# Patient Record
Sex: Female | Born: 1985
Health system: Southern US, Community
[De-identification: ages and names within clinical notes are randomized; demographics above are authoritative.]

## PROBLEM LIST (undated history)

## (undated) DIAGNOSIS — M2040 Other hammer toe(s) (acquired), unspecified foot: Secondary | ICD-10-CM

## (undated) DIAGNOSIS — M2010 Hallux valgus (acquired), unspecified foot: Secondary | ICD-10-CM

## (undated) DIAGNOSIS — T7840XA Allergy, unspecified, initial encounter: Secondary | ICD-10-CM

## (undated) DIAGNOSIS — R51 Headache: Secondary | ICD-10-CM

## (undated) DIAGNOSIS — R011 Cardiac murmur, unspecified: Secondary | ICD-10-CM

## (undated) HISTORY — DX: Allergy, unspecified, initial encounter: T78.40XA

## (undated) HISTORY — PX: NO PAST SURGERIES: SHX2092

## (undated) HISTORY — PX: FOOT SURGERY: SHX648

---
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
## 2007-01-15 ENCOUNTER — Emergency Department (HOSPITAL_COMMUNITY): Admission: EM | Admit: 2007-01-15 | Discharge: 2007-01-15 | Payer: Self-pay | Admitting: Emergency Medicine

## 2010-03-10 ENCOUNTER — Other Ambulatory Visit (HOSPITAL_COMMUNITY)
Admission: RE | Admit: 2010-03-10 | Discharge: 2010-03-10 | Disposition: A | Discharge status: Home / Self Care | Payer: BC Managed Care – PPO | Source: Ambulatory Visit | Attending: Obstetrics and Gynecology | Admitting: Obstetrics and Gynecology

## 2010-03-10 DIAGNOSIS — Z113 Encounter for screening for infections with a predominantly sexual mode of transmission: Secondary | ICD-10-CM | POA: Insufficient documentation

## 2010-03-10 DIAGNOSIS — Z01419 Encounter for gynecological examination (general) (routine) without abnormal findings: Secondary | ICD-10-CM | POA: Insufficient documentation

## 2010-03-10 DIAGNOSIS — Z1159 Encounter for screening for other viral diseases: Secondary | ICD-10-CM | POA: Insufficient documentation

## 2011-03-26 DIAGNOSIS — Z01419 Encounter for gynecological examination (general) (routine) without abnormal findings: Secondary | ICD-10-CM | POA: Insufficient documentation

## 2011-03-26 DIAGNOSIS — R87810 Cervical high risk human papillomavirus (HPV) DNA test positive: Secondary | ICD-10-CM | POA: Insufficient documentation

## 2011-04-06 DIAGNOSIS — M2040 Other hammer toe(s) (acquired), unspecified foot: Secondary | ICD-10-CM

## 2011-04-06 DIAGNOSIS — M2010 Hallux valgus (acquired), unspecified foot: Secondary | ICD-10-CM

## 2011-04-06 HISTORY — DX: Other hammer toe(s) (acquired), unspecified foot: M20.40

## 2011-04-06 HISTORY — DX: Hallux valgus (acquired), unspecified foot: M20.10

## 2011-04-09 ENCOUNTER — Other Ambulatory Visit (HOSPITAL_COMMUNITY)
Admission: RE | Admit: 2011-04-09 | Discharge: 2011-04-09 | Disposition: A | Discharge status: Home / Self Care | Payer: Managed Care, Other (non HMO) | Source: Ambulatory Visit | Attending: Obstetrics and Gynecology | Admitting: Obstetrics and Gynecology

## 2011-04-28 ENCOUNTER — Encounter (HOSPITAL_BASED_OUTPATIENT_CLINIC_OR_DEPARTMENT_OTHER): Payer: Self-pay | Admitting: *Deleted

## 2011-04-30 ENCOUNTER — Ambulatory Visit (INDEPENDENT_AMBULATORY_CARE_PROVIDER_SITE_OTHER): Payer: Managed Care, Other (non HMO) | Admitting: Family Medicine

## 2011-04-30 VITALS — BP 132/86 | HR 70 | Temp 98.3°F | Resp 16 | Ht 71.5 in | Wt 148.6 lb

## 2011-04-30 DIAGNOSIS — Z0000 Encounter for general adult medical examination without abnormal findings: Secondary | ICD-10-CM

## 2011-04-30 DIAGNOSIS — Z01818 Encounter for other preprocedural examination: Secondary | ICD-10-CM

## 2011-04-30 NOTE — Patient Instructions (Signed)
Cleared for surgery. Return to see [place] if needed.

## 2011-04-30 NOTE — Progress Notes (Signed)
Subjective: Patient is here for a preop physical exam. She is going to have podiatric surgery tomorrow an outpatient basis. She thinks she is having a general anesthetic.  Past medical history: Surgeries: None Maj. 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 Graduated from [employer] last year. He is single, no children. Does not use tobacco or drugs. Social alcohol intake.  Family history: Hypertension, high cholesterol  Review of systems: Gen.:  no complaints HEENT: Unremarkable Respiratory: Unremarkable Cardiovascular: Unremarkable GI: Unremarkable GU: Unremarkable. Menses are regular. Musculoskeletal: Unremarkable except for the foot problems  neurologic: Fairly frequent headaches, relieved by ibuprofen Endocrinologic unremarkable Dermatologic: Unremarkable  Physical exam: Filed Vitals:   04/30/11 1017  BP: 132/86  Pulse: 70  Temp: 98.3 F (36.8 C)  Resp: 16   Pleasant alert ortiented Afro-American female in no acute distress at this time HEENT: Eyes PERRLA. Fundi benign. Throat clear. Neck supple without nodes. Chest clear to auscultation. Heart regular without murmurs. No carotid bruits. Abdomen soft without masses tenderness. Extremities are without edema. Pedal pulses.  Assessment: Normal preop physical examination Hammertoe History of headaches  Plan: Patient is cleared for her surgery

## 2011-05-04 ENCOUNTER — Ambulatory Visit (HOSPITAL_BASED_OUTPATIENT_CLINIC_OR_DEPARTMENT_OTHER)
Admission: RE | Admit: 2011-05-04 | Discharge: 2011-05-04 | Disposition: A | Discharge status: Home / Self Care | Payer: Managed Care, Other (non HMO) | Source: Ambulatory Visit | Attending: Podiatry | Admitting: Podiatry

## 2011-05-04 ENCOUNTER — Encounter (HOSPITAL_BASED_OUTPATIENT_CLINIC_OR_DEPARTMENT_OTHER)
Admission: RE | Disposition: A | Discharge status: Home / Self Care | Payer: Self-pay | Source: Ambulatory Visit | Attending: Podiatry

## 2011-05-04 ENCOUNTER — Encounter (HOSPITAL_BASED_OUTPATIENT_CLINIC_OR_DEPARTMENT_OTHER): Payer: Self-pay

## 2011-05-04 ENCOUNTER — Ambulatory Visit (HOSPITAL_BASED_OUTPATIENT_CLINIC_OR_DEPARTMENT_OTHER): Payer: Managed Care, Other (non HMO) | Admitting: Anesthesiology

## 2011-05-04 ENCOUNTER — Encounter (HOSPITAL_BASED_OUTPATIENT_CLINIC_OR_DEPARTMENT_OTHER): Payer: Self-pay | Admitting: Anesthesiology

## 2011-05-04 DIAGNOSIS — M2040 Other hammer toe(s) (acquired), unspecified foot: Secondary | ICD-10-CM | POA: Insufficient documentation

## 2011-05-04 DIAGNOSIS — M21619 Bunion of unspecified foot: Secondary | ICD-10-CM | POA: Insufficient documentation

## 2011-05-04 DIAGNOSIS — R600 Localized edema: Secondary | ICD-10-CM

## 2011-05-04 HISTORY — DX: Cardiac murmur, unspecified: R01.1

## 2011-05-04 HISTORY — DX: Other hammer toe(s) (acquired), unspecified foot: M20.40

## 2011-05-04 HISTORY — DX: Headache: R51

## 2011-05-04 HISTORY — PX: HAMMER TOE SURGERY: SHX385

## 2011-05-04 HISTORY — DX: Hallux valgus (acquired), unspecified foot: M20.10

## 2011-05-04 HISTORY — PX: BUNIONECTOMY: SHX129

## 2011-05-04 SURGERY — CORRECTION, HAMMER TOE
Anesthesia: Monitor Anesthesia Care | Site: Foot | Laterality: Right | Wound class: Clean

## 2011-05-04 MED ORDER — DEXAMETHASONE SODIUM PHOSPHATE 4 MG/ML IJ SOLN
INTRAMUSCULAR | Status: DC | PRN
  Administered 2011-05-04: 10 mg via INTRAVENOUS

## 2011-05-04 MED ORDER — LACTATED RINGERS IV SOLN
INTRAVENOUS | Status: DC
  Administered 2011-05-04: 07:00:00 via INTRAVENOUS

## 2011-05-04 MED ORDER — MIDAZOLAM HCL 5 MG/5ML IJ SOLN
INTRAMUSCULAR | Status: DC | PRN
  Administered 2011-05-04: 2 mg via INTRAVENOUS

## 2011-05-04 MED ORDER — CLINDAMYCIN PHOSPHATE 300 MG/50ML IV SOLN
300.0000 mg | Freq: Once | INTRAVENOUS | Status: AC
  Administered 2011-05-04: 300 mg via INTRAVENOUS

## 2011-05-04 MED ORDER — FENTANYL CITRATE 0.05 MG/ML IJ SOLN
INTRAMUSCULAR | Status: DC | PRN
  Administered 2011-05-04: 100 ug via INTRAVENOUS

## 2011-05-04 MED ORDER — PROPOFOL 10 MG/ML IV EMUL
INTRAVENOUS | Status: DC | PRN
  Administered 2011-05-04: 100 mg via INTRAVENOUS

## 2011-05-04 MED ORDER — ACETAMINOPHEN 325 MG PO TABS: 650.0000 mg | ORAL_TABLET | ORAL | Status: DC | PRN

## 2011-05-04 MED ORDER — ACETAMINOPHEN 650 MG RE SUPP: 650.0000 mg | RECTAL | Status: DC | PRN

## 2011-05-04 MED ORDER — ONDANSETRON HCL 4 MG/2ML IJ SOLN: 4.0000 mg | Freq: Four times a day (QID) | INTRAMUSCULAR | Status: DC | PRN

## 2011-05-04 MED ORDER — SODIUM CHLORIDE 0.9 % IV SOLN: 250.0000 mL | INTRAVENOUS | Status: DC | PRN

## 2011-05-04 MED ORDER — ONDANSETRON HCL 4 MG/2ML IJ SOLN
INTRAMUSCULAR | Status: DC | PRN
  Administered 2011-05-04: 4 mg via INTRAVENOUS

## 2011-05-04 MED ORDER — FENTANYL CITRATE 0.05 MG/ML IJ SOLN
25.0000 ug | INTRAMUSCULAR | Status: DC | PRN
  Administered 2011-05-04: 50 ug via INTRAVENOUS

## 2011-05-04 MED ORDER — SODIUM CHLORIDE 0.9 % IJ SOLN: 3.0000 mL | INTRAMUSCULAR | Status: DC | PRN

## 2011-05-04 MED ORDER — PROPOFOL 10 MG/ML IV EMUL
INTRAVENOUS | Status: DC | PRN
  Administered 2011-05-04: 50 ug/kg/min via INTRAVENOUS

## 2011-05-04 MED ORDER — LIDOCAINE HCL (PF) 2 % IJ SOLN
INTRAMUSCULAR | Status: DC | PRN
  Administered 2011-05-04: 8 mL

## 2011-05-04 MED ORDER — SODIUM CHLORIDE 0.9 % IJ SOLN: 3.0000 mL | Freq: Two times a day (BID) | INTRAMUSCULAR | Status: DC

## 2011-05-04 MED ORDER — BUPIVACAINE HCL (PF) 0.5 % IJ SOLN
INTRAMUSCULAR | Status: DC | PRN
  Administered 2011-05-04: 8 mL

## 2011-05-04 SURGICAL SUPPLY — 56 items
BANDAGE ESMARK 6X9 LF (GAUZE/BANDAGES/DRESSINGS) ×2 IMPLANT
BLADE AVERAGE 25X9 (BLADE) ×2 IMPLANT
BLADE OSC/SAG .038X5.5 CUT EDG (BLADE) ×2 IMPLANT
BLADE SURG 15 STRL LF DISP TIS (BLADE) ×2 IMPLANT
BLADE SURG 15 STRL SS (BLADE) ×3
BNDG CMPR 9X6 STRL LF SNTH (GAUZE/BANDAGES/DRESSINGS) ×2
BNDG COHESIVE 4X5 TAN STRL (GAUZE/BANDAGES/DRESSINGS) ×3 IMPLANT
BNDG ESMARK 6X9 LF (GAUZE/BANDAGES/DRESSINGS) ×3 IMPLANT
CANISTER SUCTION 1200CC (MISCELLANEOUS) ×2 IMPLANT
CLOTH BEACON ORANGE TIMEOUT ST (SAFETY) ×3 IMPLANT
COVER TABLE BACK 60X90 (DRAPES) ×3 IMPLANT
DRAPE EXTREMITY T 121X128X90 (DRAPE) ×3 IMPLANT
DRAPE OEC MINIVIEW 54X84 (DRAPES) ×2 IMPLANT
DRAPE U 20/CS (DRAPES) ×3 IMPLANT
DRAPE U-SHAPE 47X51 STRL (DRAPES) ×3 IMPLANT
DURAPREP 26ML APPLICATOR (WOUND CARE) ×3 IMPLANT
ELECT REM PT RETURN 9FT ADLT (ELECTROSURGICAL) ×3 IMPLANT
ELECTRODE REM PT RTRN 9FT ADLT (ELECTROSURGICAL) ×2 IMPLANT
GAUZE XEROFORM 1X8 LF (GAUZE/BANDAGES/DRESSINGS) IMPLANT
GLOVE BIO SURGEON STRL SZ 6.5 (GLOVE) ×2 IMPLANT
GLOVE SS BIOGEL STRL SZ 8 (GLOVE) ×3 IMPLANT
GLOVE SUPERSENSE BIOGEL SZ 8 (GLOVE) ×2
GOWN PREVENTION PLUS XLARGE (GOWN DISPOSABLE) ×3 IMPLANT
GOWN PREVENTION PLUS XXLARGE (GOWN DISPOSABLE) ×5 IMPLANT
NDL 1/2 CIR CATGUT .05X1.09 (NEEDLE) IMPLANT
NEEDLE 1/2 CIR CATGUT .05X1.09 (NEEDLE) IMPLANT
NEEDLE HYPO 22GX1.5 SAFETY (NEEDLE) ×4 IMPLANT
PACK BASIN DAY SURGERY FS (CUSTOM PROCEDURE TRAY) ×3 IMPLANT
PADDING CAST ABS 4INX4YD NS (CAST SUPPLIES) ×1
PADDING CAST ABS COTTON 4X4 ST (CAST SUPPLIES) ×2 IMPLANT
PENCIL BUTTON HOLSTER BLD 10FT (ELECTRODE) ×3 IMPLANT
SCOTCHCAST PLUS 3X4 WHITE (CAST SUPPLIES) IMPLANT
SCOTCHCAST PLUS 4X4 WHITE (CAST SUPPLIES) IMPLANT
SPONGE GAUZE 4X4 12PLY (GAUZE/BANDAGES/DRESSINGS) ×3 IMPLANT
STAPLER VISISTAT 35W (STAPLE) IMPLANT
STOCKINETTE 6  STRL (DRAPES) ×1
STOCKINETTE 6 STRL (DRAPES) ×2 IMPLANT
SUCTION FRAZIER TIP 10 FR DISP (SUCTIONS) ×2 IMPLANT
SUT ETHILON 3 0 PS 1 (SUTURE) IMPLANT
SUT FIBERWIRE #2 38 T-5 BLUE (SUTURE) IMPLANT
SUT MERSILENE 2.0 SH NDLE (SUTURE) IMPLANT
SUT STEEL 2 0 (SUTURE) IMPLANT
SUT VIC AB 0 CT1 27 (SUTURE) ×3
SUT VIC AB 0 CT1 27XBRD ANBCTR (SUTURE) ×2 IMPLANT
SUT VIC AB 2-0 SH 27 (SUTURE)
SUT VIC AB 2-0 SH 27XBRD (SUTURE) IMPLANT
SUT VIC AB 3-0 SH 27 (SUTURE) ×3
SUT VIC AB 3-0 SH 27X BRD (SUTURE) ×2 IMPLANT
SUT VICRYL 4-0 PS2 18IN ABS (SUTURE) IMPLANT
SUTURE FIBERWR #2 38 T-5 BLUE (SUTURE) IMPLANT
SYR BULB 3OZ (MISCELLANEOUS) ×3 IMPLANT
SYR CONTROL 10ML LL (SYRINGE) ×4 IMPLANT
TOWEL OR 17X24 6PK STRL BLUE (TOWEL DISPOSABLE) ×6 IMPLANT
TUBE CONNECTING 20X1/4 (TUBING) ×2 IMPLANT
UNDERPAD 30X30 INCONTINENT (UNDERPADS AND DIAPERS) ×3 IMPLANT
WATER STERILE IRR 1000ML POUR (IV SOLUTION) ×3 IMPLANT

## 2011-05-04 NOTE — Op Note (Signed)
Procedure(s): HAMMER TOE CORRECTION BUNIONECTOMY Procedure Note  Kathryn Roberson female 26 y.o. 05/04/2011  Procedure(s) and Anesthesia Type:    * HAMMER TOE CORRECTION - Monitor Anesthesia Care    * BUNIONECTOMY - Monitor Anesthesia Care  Surgeon(s) and Role:    * Merwyn Katos, DPM - Primary   Indications: The patient presented with history of painful bunion and hammertoes 2,3,4 rt . A exam revealed findings of hallux valvus and hammertoes rt.. The patient now presents for bunionectomy and correction hammertoes 2,3,4 rt. after discussing therapeutic alternatives.        Surgeon: Merwyn Katos   Assistants: N/A   Anesthesia: Monitored Local Anesthesia with Sedation  ASA Class: 1    Procedure Detail  Bunionectomy Procedure Note  Indications: Severe Hallux Abducto Valgus Deformity, right foot  with symptoms limiting function.  Pre-operative Diagnosis: Hallux Abducto Valgus Deformity, right foot    Post-operative Diagnosis: Hallux Abducto Valgus Deformity, right foot   Surgeon: Merwyn Katos   Assistants: None  Anesthesia: Monitored Local Anesthesia with Sedation  ASA Class: 1  Procedure Details  The patient was seen in the Holding Room. The risks, benefits, complications, treatment options, and expected outcomes were discussed with the patient. The risks and potential complications of their problem and purposed treatment include but are not limited to infection, nerve injury, vascular injury, nonunion of the syndesmosis, persistent pain, potential skin necrosis, deep vein thrombosis, possible pulmonary embolus, complications of the anesthetics and failure of the implant.  The patient concurred with the proposed plan, giving informed consent.  The site of surgery properly noted/marked. The patient was taken to Operating Room # 1, identified as Kathryn Roberson and the procedure verified as right Bunionectomy. A Time Out was held and the above information confirmed.  The  patient was brought to the operating room, placed on the operating table in a supine position. Following the successful induction of anesthesia, an approximately 5 cm incision was made over the dorsal-medial aspect of the 1st. MPJ. The incision was then deepened through the subcutaneous tissue, using sharp and blunt dissection. Care was taken to identify and retract all vital neuro and vascular structures. All bleeders were ligated and cauterized as needed. Soft tissue dissection yielded visualization of the 1 1st. MPJ capsule and an longitudinal capsulotomy was performed over the dorsal aspect of the 1st. MPJ. All capsular structures were sharply freed from about the head of the medial aspect of the 1st. metatarsal.  A sagital saw was utilized to resect the dorsal-medial prominence. Attention was then directed into the 1st. IS via the original skin incision, where soft tissue dissection yielded visualization of the lateral joint structures. At this time the conjoined tendon was sharply transected at it's attachment to the base of the proximal phalanx in a J-stroke fashion. The lateral contracture present on the hallux was noted to be reduced, and the sesamoid apparatus was noted to float into a more corrected medial position. Correction of the deformity was assessed at this time and noted to be good. The wound was thoroughly irrigated with Kantrex solution. Redundant capsular tissue was resected as necessary. The joint capsule was closed utilizing 3-0 Vicryl..  Skin was closed utilizing 4-0 Monocril in an subcutaneous running technique. The operation site was infiltrated with approximately 8 cc's of 0.5% Marcaine plain and 2 cc's of Decadron.   Two converging semieliptical incisions made dorsal DIPJ of 2nd toe rt.. Skin segment excised and dissection carried down through [place], Several superficial vessels were cauterized  and cut. Extensor tendon transected and soft tissue freed from middle phalangeal head. Head  resect w/ sagittal saw. Surgery site irrigated with normal saline. Extensor Tendon re approximated with 4-0 vicryl. Skin closed with 4-0 nylon continuous interlocking sutures.  Two converging semieliptical incisions made dorsal DIPJ of 3rd toe rt.. Skin segment excised and dissection carried down through [place], Several superficial vessels were cauterized and cut. Extensor tendon transected and soft tissue freed from middle phalangeal head. Head resect w/ sagittal saw. Surgery site irrigated with normal saline. Extensor Tendon re approximated with 4-0 vicryl. Skin closed with 4-0 nylon continuous interlocking sutures.  Longitudinal incisionmade dorsal PIPJ of 4th toe rt.. Skin  dissection carried down through [place], Several superficial vessels were cauterized and cut. Extensor tendon transected and soft tissue freed from proximal phalangeal head. Head resect w/ sagittal saw. Surgery site irrigated with normal saline. Extensor Tendon re approximated with 4-0 vicryl. Skin closed with 4-0 nylon continuous interlocking sutures.  Applied xeroform gauze with gauze Kling dressind. Touriquet released and good vascularity noted in digits. Applied light compression dressing.  Instrument, sponge, and needle counts were correct prior to wound closure and at the conclusion of the case.   Findings: None  Estimated Blood Loss:  Minimal         Drains: None         Total IV Fluids:         Specimens: None         Implants: None         Complications:  None; patient tolerated the procedure well.         Disposition: PACU - hemodynamically stable.         Condition: stable  Attending Attestation: I was present and scrubbed for the entire procedure.HAMMER TOE CORRECTION, BUNIONECTOMY  Findings: None  Estimated Blood Loss:  Minimal         Drains: None       Blood Given: none          Specimens: None          Implants: none        Complications:  * No complications entered in OR log *          Disposition: PACU - hemodynamically stable.         Condition: stable

## 2011-05-04 NOTE — Transfer of Care (Signed)
Immediate Anesthesia Transfer of Care Note  Patient: Kathryn Roberson  Procedure(s) Performed: Procedure(s) (LRB): HAMMER TOE CORRECTION (Right) BUNIONECTOMY (Right)  Patient Location: PACU  Anesthesia Type: MAC  Level of Consciousness: awake, alert  and oriented  Airway & Oxygen Therapy: Patient Spontanous Breathing and Patient connected to face mask oxygen  Post-op Assessment: Report given to PACU RN and Post -op Vital signs reviewed and stable  Post vital signs: Reviewed and stable  Complications: No apparent anesthesia complications

## 2011-05-04 NOTE — H&P (Signed)
Kathryn Roberson is an 26 y.o. female.   Chief Complaint: painful bunion and hammer toes 2,3,4 RT HPI: Pain at pedal deformities despite concervative tx.  Past Medical History  Diagnosis Date  . Headache     migraines  . Heart murmur as a child    states never had any problems  . Hallux valgus 04/2011    right  . Hammer toe 04/2011    right 2nd, 3rd, 4th toes    Past Surgical History  Procedure Date  . No past surgeries     History reviewed. No pertinent family history. Social History:  reports that she has never smoked. She has never used smokeless tobacco. She reports that she drinks alcohol. She reports that she does not use illicit drugs.  Allergies:  Allergies  Allergen Reactions  . Penicillins Rash    Medications Prior to Admission  Medication Sig Dispense Refill  . norethindrone-ethinyl estradiol (MICROGESTIN,JUNEL,LOESTRIN) 1-20 MG-MCG tablet Take 1 tablet by mouth daily. AM        No results found for this or any previous visit (from the past 48 hour(s)). No results found.  ROS  Blood pressure 154/105, pulse 54, temperature 97.6 F (36.4 C), temperature source Oral, resp. rate 13, height 5\' 11"  (1.803 m), weight 65.772 kg (145 lb), last menstrual period 04/26/2011, SpO2 100.00%. Physical Exam   Assessment/Plan Hallux valgus RT, hammertoes 2,3,4 RT Modifed McBride bunionectomy w/ possible Akin, correction hammertoes 2,3,4 RT   Merwyn Katos 05/04/2011, 9:42 AM

## 2011-05-04 NOTE — Anesthesia Postprocedure Evaluation (Signed)
Anesthesia Post Note  Patient: Kathryn Roberson  Procedure(s) Performed: Procedure(s) (LRB): HAMMER TOE CORRECTION (Right) BUNIONECTOMY (Right)  Anesthesia type: MAC  Patient location: PACU  Post pain: Pain level controlled and Adequate analgesia  Post assessment: Post-op Vital signs reviewed, Patient's Cardiovascular Status Stable and Respiratory Function Stable  Last Vitals:  Filed Vitals:   05/04/11 0930  BP: 154/105  Pulse: 54  Temp:   Resp: 13    Post vital signs: Reviewed and stable  Level of consciousness: awake, alert  and oriented  Complications: No apparent anesthesia complications

## 2011-05-04 NOTE — Discharge Instructions (Signed)
Call your surgeon if you experience:   1.  Fever over 101.0. 2.  Inability to urinate. 3.  Nausea and/or vomiting. 4.  Extreme swelling or bruising at the surgical site. 5.  Continued bleeding from the incision. 6.  Increased pain, redness or drainage from the incision. 7.  Problems related to your pain medication.  Post Anesthesia Home Care Instructions  Activity: Get plenty of rest for the remainder of the day. A responsible adult should stay with you for 24 hours following the procedure.  For the next 24 hours, DO NOT: -Drive a car -Operate machinery -Drink alcoholic beverages -Take any medication unless instructed by your physician -Make any legal decisions or sign important papers.  Meals: Start with liquid foods such as gelatin or soup. Progress to regular foods as tolerated. Avoid greasy, spicy, heavy foods. If nausea and/or vomiting occur, drink only clear liquids until the nausea and/or vomiting subsides. Call your physician if vomiting continues.  Special Instructions/Symptoms: Your throat may feel dry or sore from the anesthesia or the breathing tube placed in your throat during surgery. If this causes discomfort, gargle with warm salt water. The discomfort should disappear within 24 hours.   

## 2011-05-04 NOTE — Anesthesia Preprocedure Evaluation (Signed)
Anesthesia Evaluation  Patient identified by MRN, date of birth, ID band Patient awake    Reviewed: Allergy & Precautions, H&P , NPO status , Patient's Chart, lab work & pertinent test results  Airway Mallampati: II  Neck ROM: full    Dental   Pulmonary          Cardiovascular     Neuro/Psych  Headaches,    GI/Hepatic   Endo/Other    Renal/GU      Musculoskeletal   Abdominal   Peds  Hematology   Anesthesia Other Findings   Reproductive/Obstetrics                           Anesthesia Physical Anesthesia Plan  ASA: I  Anesthesia Plan: MAC   Post-op Pain Management:    Induction: Intravenous  Airway Management Planned: Simple Face Mask  Additional Equipment:   Intra-op Plan:   Post-operative Plan:   Informed Consent: I have reviewed the patients History and Physical, chart, labs and discussed the procedure including the risks, benefits and alternatives for the proposed anesthesia with the patient or authorized representative who has indicated his/her understanding and acceptance.     Plan Discussed with: CRNA and Surgeon  Anesthesia Plan Comments:         Anesthesia Quick Evaluation

## 2011-05-05 ENCOUNTER — Encounter (HOSPITAL_BASED_OUTPATIENT_CLINIC_OR_DEPARTMENT_OTHER): Payer: Self-pay | Admitting: Podiatry

## 2011-07-28 ENCOUNTER — Ambulatory Visit (INDEPENDENT_AMBULATORY_CARE_PROVIDER_SITE_OTHER): Payer: Managed Care, Other (non HMO) | Admitting: Emergency Medicine

## 2011-07-28 VITALS — BP 126/72 | HR 78 | Temp 98.1°F | Resp 16 | Ht 71.5 in | Wt 144.0 lb

## 2011-07-28 DIAGNOSIS — L508 Other urticaria: Secondary | ICD-10-CM

## 2011-07-28 MED ORDER — METHYLPREDNISOLONE ACETATE 80 MG/ML IJ SUSP
120.0000 mg | Freq: Once | INTRAMUSCULAR | Status: AC
  Administered 2011-07-28: 120 mg via INTRAMUSCULAR

## 2011-07-28 NOTE — Progress Notes (Signed)
   Date:  07/28/2011   Name:  LAVONDA THAL   DOB:  07-07-1985   MRN:  [id number]  PCP:  Janace Hoard, MD    Chief Complaint: Rash   History of Present Illness:  Kathryn Roberson is a 26 y.o. very pleasant female patient who presents with the following:  Developed generalized hives after working with boxes of old clothing stock at work.  No respiratory symptoms, denies other contact with allergen or personal products  There is no problem list on file for this patient.   Past Medical History  Diagnosis Date  . Headache     migraines  . Heart murmur as a child    states never had any problems  . Hallux valgus 04/2011    right  . Hammer toe 04/2011    right 2nd, 3rd, 4th toes    Past Surgical History  Procedure Date  . No past surgeries   . Hammer toe surgery 05/04/2011    Procedure: HAMMER TOE CORRECTION;  Surgeon: Merwyn Katos, DPM;  Location: [facility];  Service: Podiatry;  Laterality: Right;  right 2nd,3rd,&4th  . Bunionectomy 05/04/2011    Procedure: Arbutus Leas;  Surgeon: Merwyn Katos, DPM;  Location: [facility];  Service: Podiatry;  Laterality: Right;  mcbride bunionectomy right foot    History  Substance Use Topics  . Smoking status: Never Smoker   . Smokeless tobacco: Never Used  . Alcohol Use: Yes     socially    No family history on file.  Allergies  Allergen Reactions  . Penicillins Rash    Medication list has been reviewed and updated.  Current Outpatient Prescriptions on File Prior to Visit  Medication Sig Dispense Refill  . norethindrone-ethinyl estradiol (MICROGESTIN,JUNEL,LOESTRIN) 1-20 MG-MCG tablet Take 1 tablet by mouth daily. AM       No current facility-administered medications on file prior to visit.    Review of Systems:  As per HPI, otherwise negative.    Physical Examination: Filed Vitals:   07/28/11 1256  BP: 126/72  Pulse: 78  Temp: 98.1 F (36.7 C)  Resp: 16   Filed Vitals:   07/28/11 1256  Height: 5' 11.5" (1.816 m)  Weight: 144 lb (65.318 kg)   Body mass index is 19.80 kg/(m^2). Ideal Body Weight: Weight in (lb) to have BMI = 25: 181.4    GEN: WDWN, NAD, Non-toxic, Alert & Oriented x 3 HEENT: Atraumatic, Normocephalic.  Ears and Nose: No external deformity. EXTR: No clubbing/cyanosis/edema NEURO: Normal gait.  PSYCH: Normally interactive. Conversant. Not depressed or anxious appearing.  Calm demeanor.  Skin:  Hives arms, chest and upper back Chest:  CTA, BS=  Assessment and Plan: Urticaria Depo medrol Veryl Speak, Tessa Lerner, MD

## 2011-10-30 ENCOUNTER — Ambulatory Visit (INDEPENDENT_AMBULATORY_CARE_PROVIDER_SITE_OTHER): Payer: Managed Care, Other (non HMO) | Admitting: Physician Assistant

## 2011-10-30 VITALS — BP 126/76 | HR 77 | Temp 98.2°F | Wt 145.0 lb

## 2011-10-30 DIAGNOSIS — R300 Dysuria: Secondary | ICD-10-CM

## 2011-10-30 DIAGNOSIS — N390 Urinary tract infection, site not specified: Secondary | ICD-10-CM

## 2011-10-30 DIAGNOSIS — R350 Frequency of micturition: Secondary | ICD-10-CM

## 2011-10-30 LAB — POCT URINALYSIS DIPSTICK
Bilirubin, UA: NEGATIVE
Glucose, UA: NEGATIVE
Ketones, UA: NEGATIVE
Nitrite, UA: POSITIVE
Protein, UA: 100
Spec Grav, UA: 1.02
Urobilinogen, UA: 1
pH, UA: 8

## 2011-10-30 LAB — POCT UA - MICROSCOPIC ONLY
Casts, Ur, LPF, POC: NEGATIVE
Crystals, Ur, HPF, POC: NEGATIVE
Yeast, UA: NEGATIVE

## 2011-10-30 MED ORDER — NITROFURANTOIN MONOHYD MACRO 100 MG PO CAPS: 100.0000 mg | ORAL_CAPSULE | Freq: Two times a day (BID) | ORAL | Status: DC

## 2011-10-30 NOTE — Progress Notes (Signed)
Reviewed and agree.

## 2011-10-30 NOTE — Progress Notes (Signed)
Subjective:    Patient ID: Kathryn Roberson, female    DOB: May 20, 1985, 26 y.o.   MRN: [id number]  HPI  Kathryn Roberson is a 26 yr old female with a 2 day history of dysuria and frequency.  She has never had a UTI before, but some members of her family get them frequently.  No fever, chills, abd pain, NVD.  No vaginal discharge or concern for STI.  Was recently at obgyn and was tested.   Additionally she would like to be checked for scabies.  Her boyfriend's younger brother had scabies about a week and half ago.  A cousin also had scabies.  She denies any itching, rash, or bites but is concerned about potential exposure.     Review of Systems  Constitutional: Negative for fever and chills.  HENT: Negative.   Respiratory: Negative for cough and choking.   Cardiovascular: Negative.   Gastrointestinal: Negative for nausea, vomiting, abdominal pain, diarrhea and constipation.  Genitourinary: Positive for dysuria, frequency, vaginal discharge and vaginal pain. Negative for urgency and hematuria.  Musculoskeletal: Negative.   Skin: Negative for color change and rash.  Neurological: Negative.        Objective:   Physical Exam  Vitals reviewed. Constitutional: She is oriented to person, place, and time. She appears well-developed and well-nourished. No distress.  HENT:  Head: Normocephalic and atraumatic.  Cardiovascular: Normal rate, regular rhythm, normal heart sounds and intact distal pulses.   Pulmonary/Chest: Breath sounds normal. She has no wheezes. She has no rales.  Abdominal: Soft. Normal appearance and bowel sounds are normal. She exhibits no distension and no mass. There is no hepatosplenomegaly. There is no tenderness. There is no rigidity, no rebound, no guarding and no CVA tenderness.  Neurological: She is alert and oriented to person, place, and time.  Skin: Skin is warm and dry. No lesion and no rash noted. No erythema.  Psychiatric: She has a normal mood and affect. Her  behavior is normal.    Filed Vitals:   10/30/11 1121  BP: 126/76  Pulse: 77  Temp: 98.2 F (36.8 C)   Results for orders placed in visit on 10/30/11  POCT URINALYSIS DIPSTICK      Component Value Range   Color, UA yellow     Clarity, UA cloudy     Glucose, UA neg     Bilirubin, UA neg     Ketones, UA neg     Spec Grav, UA 1.020     Blood, UA large     pH, UA 8.0     Protein, UA 100     Urobilinogen, UA 1.0     Nitrite, UA positive     Leukocytes, UA large (3+)    POCT UA - MICROSCOPIC ONLY      Component Value Range   WBC, Ur, HPF, POC 30-40     RBC, urine, microscopic 5-8     Bacteria, U Microscopic 2+     Mucus, UA trace     Epithelial cells, urine per micros 2-5     Crystals, Ur, HPF, POC neg     Casts, Ur, LPF, POC neg     Yeast, UA neg           Assessment & Plan:   1. UTI (lower urinary tract infection)  nitrofurantoin, macrocrystal-monohydrate, (MACROBID) 100 MG capsule, Urine culture  2. Urine frequency  POCT urinalysis dipstick, POCT UA - Microscopic Only, nitrofurantoin, macrocrystal-monohydrate, (MACROBID) 100 MG capsule  3. Dysuria  nitrofurantoin, macrocrystal-monohydrate, (MACROBID) 100 MG capsule   Kathryn Roberson is a 26 yr old female with UTI.  Will treat with macrobid BID x 5 days.  Encouraged plenty of fluids and Azo for symptomatic relief if desired.  As she has no symptoms or exam findings that would suggest scabies, I reassured her that she does not have scabies at this time.  Encouraged her to come back in if she develops a rash or itching.

## 2011-10-30 NOTE — Patient Instructions (Signed)
Urinary Tract Infection Urinary tract infections (UTIs) can develop anywhere along your urinary tract. Your urinary tract is your body's drainage system for removing wastes and extra water. Your urinary tract includes two kidneys, two ureters, a bladder, and a urethra. Your kidneys are a pair of bean-shaped organs. Each kidney is about the size of your fist. They are located below your ribs, one on each side of your spine. CAUSES Infections are caused by microbes, which are microscopic organisms, including fungi, viruses, and bacteria. These organisms are so small that they can only be seen through a microscope. Bacteria are the microbes that most commonly cause UTIs. SYMPTOMS  Symptoms of UTIs may vary by age and gender of the patient and by the location of the infection. Symptoms in young women typically include a frequent and intense urge to urinate and a painful, burning feeling in the bladder or urethra during urination. Older women and men are more likely to be tired, shaky, and weak and have muscle aches and abdominal pain. A fever may mean the infection is in your kidneys. Other symptoms of a kidney infection include pain in your back or sides below the ribs, nausea, and vomiting. DIAGNOSIS To diagnose a UTI, your caregiver will ask you about your symptoms. Your caregiver also will ask to provide a urine sample. The urine sample will be tested for bacteria and white blood cells. White blood cells are made by your body to help fight infection. TREATMENT  Typically, UTIs can be treated with medication. Because most UTIs are caused by a bacterial infection, they usually can be treated with the use of antibiotics. The choice of antibiotic and length of treatment depend on your symptoms and the type of bacteria causing your infection. HOME CARE INSTRUCTIONS  If you were prescribed antibiotics, take them exactly as your caregiver instructs you. Finish the medication even if you feel better after you  have only taken some of the medication.  Drink enough water and fluids to keep your urine clear or pale yellow.  Avoid caffeine, tea, and carbonated beverages. They tend to irritate your bladder.  Empty your bladder often. Avoid holding urine for long periods of time.  Empty your bladder before and after sexual intercourse.  After a bowel movement, women should cleanse from front to back. Use each tissue only once. SEEK MEDICAL CARE IF:   You have back pain.  You develop a fever.  Your symptoms do not begin to resolve within 3 days. SEEK IMMEDIATE MEDICAL CARE IF:   You have severe back pain or lower abdominal pain.  You develop chills.  You have nausea or vomiting.  You have continued burning or discomfort with urination. MAKE SURE YOU:   Understand these instructions.  Will watch your condition.  Will get help right away if you are not doing well or get worse. Document Released: 10/01/2004 Document Revised: 06/23/2011 Document Reviewed: 01/30/2011 Exit[employer] Patient Information 2013 [employer].  

## 2011-11-02 LAB — URINE CULTURE: Colony Count: >=100000

## 2011-12-15 ENCOUNTER — Ambulatory Visit (INDEPENDENT_AMBULATORY_CARE_PROVIDER_SITE_OTHER): Payer: Managed Care, Other (non HMO) | Admitting: Family Medicine

## 2011-12-15 VITALS — BP 120/82 | HR 80 | Temp 98.5°F | Resp 18 | Ht 72.0 in | Wt 147.0 lb

## 2011-12-15 DIAGNOSIS — I889 Nonspecific lymphadenitis, unspecified: Secondary | ICD-10-CM

## 2011-12-15 NOTE — Progress Notes (Signed)
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 She had this scalp pain on the right from tight braiding. The last 24-hour she's noticed a right posterior cervical subcutaneous nodule that she's concerned about. In addition she's had some mild achiness around her neck.  Patient has no night sweats, fevers or loss of appetite or weight.  Objective: 1/2 cm firm nontender posterior lymph node on the right side. There is no other adenopathy. Patient's neck is supple. Skin is unremarkable.  Assessment: Reactive lymphadenopathy to the here treatment  Plan: Reassurance and given warning signs for one lymph node swelling needs further investigation.

## 2011-12-15 NOTE — Patient Instructions (Signed)
Swollen Lymph Nodes  The lymphatic system filters fluid from around cells. It is like a system of blood vessels. These channels carry lymph instead of blood. The lymphatic system is an important part of the immune (disease fighting) system. When people talk about "swollen glands in the neck," they are usually talking about swollen lymph nodes. The lymph nodes are like the little traps for infection. You and your caregiver may be able to feel lymph nodes, especially swollen nodes, in these common areas: the groin (inguinal area), armpits (axilla), and above the clavicle (supraclavicular). You may also feel them in the neck (cervical) and the back of the head just above the hairline (occipital).  Swollen glands occur when there is any condition in which the body responds with an allergic type of reaction. For instance, the glands in the neck can become swollen from insect bites or any type of minor infection on the head. These are very noticeable in children with only minor problems. Lymph nodes may also become swollen when there is a tumor or problem with the lymphatic system, such as Hodgkin's disease.  TREATMENT   · Most swollen glands do not require treatment. They can be observed (watched) for a short period of time, if your caregiver feels it is necessary. Most of the time, observation is not necessary.  · Antibiotics (medicines that kill germs) may be prescribed by your caregiver. Your caregiver may prescribe these if he or she feels the swollen glands are due to a bacterial (germ) infection. Antibiotics are not used if the swollen glands are caused by a virus.  HOME CARE INSTRUCTIONS   · Take medications as directed by your caregiver. Only take over-the-counter or prescription medicines for pain, discomfort, or fever as directed by your caregiver.  SEEK MEDICAL CARE IF:   · If you begin to run a temperature greater than 102° F (38.9° C), or as your caregiver suggests.  MAKE SURE YOU:   · Understand these  instructions.  · Will watch your condition.  · Will get help right away if you are not doing well or get worse.  Document Released: 12/12/2001 Document Revised: 03/16/2011 Document Reviewed: 12/22/2004  [employer]® Patient Information ©2013 [employer].

## 2011-12-25 ENCOUNTER — Other Ambulatory Visit: Payer: Self-pay | Admitting: Obstetrics and Gynecology

## 2011-12-25 ENCOUNTER — Other Ambulatory Visit (HOSPITAL_COMMUNITY)
Admission: RE | Admit: 2011-12-25 | Discharge: 2011-12-25 | Disposition: A | Discharge status: Home / Self Care | Payer: Managed Care, Other (non HMO) | Source: Ambulatory Visit | Attending: Obstetrics and Gynecology | Admitting: Obstetrics and Gynecology

## 2011-12-25 DIAGNOSIS — Z01419 Encounter for gynecological examination (general) (routine) without abnormal findings: Secondary | ICD-10-CM | POA: Insufficient documentation

## 2011-12-25 DIAGNOSIS — Z1151 Encounter for screening for human papillomavirus (HPV): Secondary | ICD-10-CM | POA: Insufficient documentation

## 2012-01-28 ENCOUNTER — Ambulatory Visit (INDEPENDENT_AMBULATORY_CARE_PROVIDER_SITE_OTHER): Payer: Managed Care, Other (non HMO) | Admitting: Emergency Medicine

## 2012-01-28 VITALS — BP 141/94 | HR 77 | Temp 98.7°F | Resp 16 | Ht 73.0 in | Wt 147.6 lb

## 2012-01-28 DIAGNOSIS — R51 Headache: Secondary | ICD-10-CM

## 2012-01-28 DIAGNOSIS — G43909 Migraine, unspecified, not intractable, without status migrainosus: Secondary | ICD-10-CM

## 2012-01-28 MED ORDER — OXYCODONE-ACETAMINOPHEN 5-325 MG PO TABS: 1.0000 | ORAL_TABLET | Freq: Three times a day (TID) | ORAL | Status: DC | PRN

## 2012-01-28 NOTE — Patient Instructions (Addendum)
Tyramine Restricted Diet A low-tyramine diet consists of foods that are low in the amino acid tyramine. Tyramine increases in food as it ages. INDICATIONS FOR USE  Your caregiver may ask you to follow a tyramine restricted diet while taking certain medicines. One such class of medications is called monoamine oxadase inhibitors (MAOI). These medicines may cause sudden increases in blood pressure, headaches, nausea, and vomiting if high amounts of tyramine are consumed while taking them.  GUIDELINES   Store foods at proper temperatures.  Cook or freeze foods the same day they are purchased from the grocery store.  Foods should be eaten the same day they are cooked.Do not eat foods that have been in the refrigerator longer than 48 hours.  Freezing cooked or fresh foods within 24 hours will keep tyramine from increasing in the food.  Check all expiration dates on packages. Do not eat foods that are expired or near the expiration date.  Do not eat aged or fermented foods. Grains  Allowed/Recommended: All.  Avoid: None. Vegetables  Allowed/Recommended: All fresh and frozen.  Avoid: Spoiled, moldy vegetables. Fermented or pickled vegetables, such as sauerkraut and kimchi. Fava and broad beans. Fruit  Allowed/Recommended: All fresh and frozen.  Avoid: Spoiled, moldy fruit. Meat and Meat Substitutes  Allowed/Recommended: [employer], poultry, and fish. Smoked meats and pepperoni from the [place].  Avoid: Spoiled or expired meat. Fermented meats, such as chorizo, salchichon, corned beef, imported pepperoni, and dry sausages such as mortadella. Milk  Allowed/Recommended: Milk products. Fresh cheeses such as cottage cheese, American cheese, and ricotta cheese.  Avoid: Aged cheeses. Fats and Oils  Allowed/Recommended: All.  Avoid: None. Beverages  Allowed/Recommended: All, except those listed to avoid.  Avoid: [place] beer. Limit alcohol and beer to 1 portion daily (12 oz  beer or 4 oz of red or white wine). Condiments/Miscellaneous  Allowed/Recommended: All, except those listed to avoid.  Avoid: Soy sauce and pickles. Soybean curd, yeast extracts, and fish sauce. Document Released: 04/18/2010 Document Revised: 03/16/2011 Document Reviewed: 04/18/2010 [facility] Patient Information 2013 [place], [place]. Migraine Headache A migraine headache is an intense, throbbing pain on one or both sides of your head. A migraine can last for 30 minutes to several hours. CAUSES  The exact cause of a migraine headache is not always known. However, a migraine may be caused when nerves in the brain become irritated and release chemicals that cause inflammation. This causes pain. SYMPTOMS  Pain on one or both sides of your head.  Pulsating or throbbing pain.  Severe pain that prevents daily activities.  Pain that is aggravated by any physical activity.  Nausea, vomiting, or both.  Dizziness.  Pain with exposure to bright lights, loud noises, or activity.  General sensitivity to bright lights, loud noises, or smells. Before you get a migraine, you may get warning signs that a migraine is coming (aura). An aura may include:  Seeing flashing lights.  Seeing bright spots, halos, or zig-zag lines.  Having tunnel vision or blurred vision.  Having feelings of numbness or tingling.  Having trouble talking.  Having muscle weakness. MIGRAINE TRIGGERS  Alcohol.  Smoking.  Stress.  Menstruation.  Aged cheeses.  Foods or drinks that contain nitrates, glutamate, aspartame, or tyramine.  Lack of sleep.  Chocolate.  Caffeine.  Hunger.  Physical exertion.  Fatigue.  Medicines used to treat chest pain (nitroglycerine), birth control pills, estrogen, and some blood pressure medicines. DIAGNOSIS  A migraine headache is often diagnosed based on:  Symptoms.  Physical  examination.  A CT scan or MRI of your head. TREATMENT Medicines may be given for  pain and nausea. Medicines can also be given to help prevent recurrent migraines.  HOME CARE INSTRUCTIONS  Only take over-the-counter or prescription medicines for pain or discomfort as directed by your caregiver. The use of long-term narcotics is not recommended.  Lie down in a dark, quiet room when you have a migraine.  Keep a journal to find out what may trigger your migraine headaches. For example, write down:  What you eat and drink.  How much sleep you get.  Any change to your diet or medicines.  Limit alcohol consumption.  Quit smoking if you smoke.  Get 7 to 9 hours of sleep, or as recommended by your caregiver.  Limit stress.  Keep lights dim if bright lights bother you and make your migraines worse. SEEK IMMEDIATE MEDICAL CARE IF:   Your migraine becomes severe.  You have a fever.  You have a stiff neck.  You have vision loss.  You have muscular weakness or loss of muscle control.  You start losing your balance or have trouble walking.  You feel faint or pass out.  You have severe symptoms that are different from your first symptoms. MAKE SURE YOU:   Understand these instructions.  Will watch your condition.  Will get help right away if you are not doing well or get worse. Document Released: 12/22/2004 Document Revised: 03/16/2011 Document Reviewed: 12/12/2010 [facility] Patient Information 2013 [place], [place].

## 2012-01-28 NOTE — Progress Notes (Signed)
  Subjective:    Patient ID: Kathryn Roberson, female    DOB: [birth year], 27 y.o.   MRN: [id number]  HPI Patient presents with a really bad migraine headache. Pt takes Rizatriptan prescribed by Dr. Terrace Arabia 2-3 times a week. She is not on any maintenance medication for the migraines. She was diagnosed with them as an elementary aged child. She used to treat them with caffeine when she was younger.  She gets the headaches every week. She is not aware of any triggers. Pain is on her left side and midline, vision is being effected in her left eye.  She feels weak and nauseated.    Review of Systems     Objective:   Physical Exam patient looks uncomfortable with a headache. Are equal and reactive to light. Disc margins are sharp. Extraocular muscle movement is normal. Neck is supple. Deep tendon reflexes of the lower extremities are 2+ and symmetrical.        Assessment & Plan:  I'm going to give her information on a Tyramine free diet. She was given #10 Percocet 5 to have her severe headaches. She was instructed to see Dr. Terrace Arabia for evaluation.

## 2012-02-15 ENCOUNTER — Other Ambulatory Visit: Payer: Managed Care, Other (non HMO)

## 2012-06-27 ENCOUNTER — Other Ambulatory Visit: Payer: Self-pay | Admitting: Obstetrics and Gynecology

## 2012-06-27 ENCOUNTER — Other Ambulatory Visit (HOSPITAL_COMMUNITY)
Admission: RE | Admit: 2012-06-27 | Discharge: 2012-06-27 | Disposition: A | Discharge status: Home / Self Care | Payer: BC Managed Care – PPO | Source: Ambulatory Visit | Attending: Obstetrics and Gynecology | Admitting: Obstetrics and Gynecology

## 2012-06-27 DIAGNOSIS — Z01419 Encounter for gynecological examination (general) (routine) without abnormal findings: Secondary | ICD-10-CM | POA: Insufficient documentation

## 2012-06-27 DIAGNOSIS — R87810 Cervical high risk human papillomavirus (HPV) DNA test positive: Secondary | ICD-10-CM | POA: Insufficient documentation

## 2012-06-27 DIAGNOSIS — Z1151 Encounter for screening for human papillomavirus (HPV): Secondary | ICD-10-CM | POA: Insufficient documentation

## 2014-03-15 ENCOUNTER — Other Ambulatory Visit (HOSPITAL_COMMUNITY)
Admission: RE | Admit: 2014-03-15 | Discharge: 2014-03-15 | Disposition: A | Discharge status: Home / Self Care | Payer: 59 | Source: Ambulatory Visit | Attending: Obstetrics and Gynecology | Admitting: Obstetrics and Gynecology

## 2014-03-15 ENCOUNTER — Other Ambulatory Visit (HOSPITAL_COMMUNITY): Admission: RE | Admit: 2014-03-15 | Payer: 59 | Source: Ambulatory Visit | Admitting: Obstetrics and Gynecology

## 2014-03-15 ENCOUNTER — Other Ambulatory Visit: Payer: Self-pay | Admitting: Obstetrics and Gynecology

## 2014-03-15 DIAGNOSIS — R87810 Cervical high risk human papillomavirus (HPV) DNA test positive: Secondary | ICD-10-CM | POA: Insufficient documentation

## 2014-03-15 DIAGNOSIS — Z113 Encounter for screening for infections with a predominantly sexual mode of transmission: Secondary | ICD-10-CM | POA: Insufficient documentation

## 2014-03-15 DIAGNOSIS — Z01411 Encounter for gynecological examination (general) (routine) with abnormal findings: Secondary | ICD-10-CM | POA: Insufficient documentation

## 2014-03-15 DIAGNOSIS — Z1151 Encounter for screening for human papillomavirus (HPV): Secondary | ICD-10-CM | POA: Insufficient documentation

## 2014-03-22 LAB — CYTOLOGY - PAP

## 2014-08-11 ENCOUNTER — Ambulatory Visit (INDEPENDENT_AMBULATORY_CARE_PROVIDER_SITE_OTHER): Payer: 59 | Admitting: Physician Assistant

## 2014-08-11 VITALS — BP 120/74 | HR 73 | Temp 98.4°F | Resp 18 | Ht 71.25 in | Wt 141.4 lb

## 2014-08-11 DIAGNOSIS — R5383 Other fatigue: Secondary | ICD-10-CM

## 2014-08-11 DIAGNOSIS — Z139 Encounter for screening, unspecified: Secondary | ICD-10-CM | POA: Diagnosis not present

## 2014-08-11 LAB — POCT CBC
Granulocyte percent: 61.1 %G (ref 37–80)
HCT, POC: 42.6 % (ref 37.7–47.9)
Hemoglobin: 13.6 g/dL (ref 12.2–16.2)
Lymph, poc: 1.4 (ref 0.6–3.4)
MCH, POC: 26.8 pg — AB (ref 27–31.2)
MCHC: 32 g/dL (ref 31.8–35.4)
MCV: 83.6 fL (ref 80–97)
MID (cbc): 0.3 (ref 0–0.9)
MPV: 8.6 fL (ref 0–99.8)
POC Granulocyte: 2.7 (ref 2–6.9)
POC LYMPH PERCENT: 32.1 %L (ref 10–50)
POC MID %: 6.8 %M (ref 0–12)
Platelet Count, POC: 246 10*3/uL (ref 142–424)
RBC: 5.09 M/uL (ref 4.04–5.48)
RDW, POC: 13.9 %
WBC: 4.4 10*3/uL — AB (ref 4.6–10.2)

## 2014-08-11 LAB — POCT UA - MICROSCOPIC ONLY
Casts, Ur, LPF, POC: NEGATIVE
Crystals, Ur, HPF, POC: NEGATIVE
Yeast, UA: NEGATIVE

## 2014-08-11 LAB — POCT URINALYSIS DIPSTICK
Bilirubin, UA: NEGATIVE
Blood, UA: NEGATIVE
Glucose, UA: NEGATIVE
Ketones, UA: NEGATIVE
Nitrite, UA: NEGATIVE
Spec Grav, UA: 1.025
Urobilinogen, UA: 0.2
pH, UA: 5.5

## 2014-08-11 LAB — POCT GLYCOSYLATED HEMOGLOBIN (HGB A1C): Hemoglobin A1C: 5.9

## 2014-08-11 LAB — POCT URINE PREGNANCY: Preg Test, Ur: NEGATIVE

## 2014-08-11 NOTE — Progress Notes (Signed)
08/13/2014 at 7:19 PM  Kathryn Roberson / DOB: 1985/11/05 / MRN: [id number]  The patient  does not have a problem list on file.  SUBJECTIVE  Chief complaint: Generalized Body Aches \History of present illness: Kathryn Roberson is 29 y.o. well appearing female presenting for muscle aches that started abruptly last three days ago which she states are moderate. Associated symptoms include fatigue, and feeling that she has the flu.  She denies nausea, weight loss, cough, dysuria, frequency, urgency, and recent medication changes. Nothing aggravates her symptoms, and nothing alleviates them. She has tried cold medication with poor relief. She denies a family history of rheumatological disease. She denies rash and tic bites.     She  has a past medical history of Headache(784.0); Heart murmur (as a child); Hallux valgus (04/2011); Hammer toe (04/2011); and Allergy.    Medications reviewed and updated by myself where necessary, and exist elsewhere in the encounter.   Kathryn Roberson is allergic to penicillins. She  reports that she has never smoked. She has never used smokeless tobacco. She reports that she drinks alcohol. She reports that she does not use illicit drugs. She  reports that she currently engages in sexual activity. She reports using the following method of birth control/protection: Pill. The patient  has past surgical history that includes No past surgeries; Hammer toe surgery (05/04/2011); Bunionectomy (05/04/2011); and Foot surgery.  Her family history includes Diabetes in her maternal grandmother and paternal grandmother; Hypertension in her maternal grandmother, mother, and paternal grandmother.  Review of Systems  Constitutional: Negative for fever and chills.  Respiratory: Negative for cough.   Cardiovascular: Negative for chest pain.  Genitourinary: Negative for dysuria, urgency, frequency, hematuria and flank pain.  Musculoskeletal: Negative for myalgias.  Skin: Negative for itching and  rash.  Neurological: Negative for dizziness, tingling and headaches.    OBJECTIVE  Her  height is 5' 11.25" (1.81 m) and weight is 141 lb 6.4 oz (64.139 kg). Her oral temperature is 98.4 F (36.9 C). Her blood pressure is 120/74 and her pulse is 73. Her respiration is 18 and oxygen saturation is 99%.  The patient's body mass index is 19.58 kg/(m^2).  Physical Exam  Vitals reviewed. Constitutional: She is oriented to person, place, and time. She appears well-developed and well-nourished. No distress.  HENT:  Head: Normocephalic and atraumatic.  Right Ear: Hearing and tympanic membrane normal.  Left Ear: Hearing and tympanic membrane normal.  Nose: Nose normal.  Mouth/Throat: Uvula is midline, oropharynx is clear and moist and mucous membranes are normal.  Eyes: Conjunctivae and EOM are normal. Pupils are equal, round, and reactive to light.  Neck: No thyromegaly present.  Cardiovascular: Normal rate.   Respiratory: Effort normal.  GI: She exhibits no distension.  Musculoskeletal: Normal range of motion.  Neurological: She is alert and oriented to person, place, and time. She has normal reflexes. She displays no atrophy and no tremor. No cranial nerve deficit or sensory deficit. She exhibits normal muscle tone. She displays no seizure activity. Coordination and gait normal. GCS eye subscore is 4. GCS verbal subscore is 5. GCS motor subscore is 6.  Skin: Skin is warm, dry and intact. She is not diaphoretic.  Psychiatric: She has a normal mood and affect.   Recent Results (from the past 2160 hour(s))  POCT UA - Microscopic Only     Status: None   Collection Time: 08/11/14  3:30 PM  Result Value Ref Range   WBC, Ur, HPF, POC 1-5  RBC, urine, microscopic 0-1    Bacteria, U Microscopic 1+    Mucus, UA moderate    Epithelial cells, urine per micros 1-5    Crystals, Ur, HPF, POC negative    Casts, Ur, LPF, POC negative    Yeast, UA negative   POCT urinalysis dipstick     Status:  Abnormal   Collection Time: 08/11/14  3:30 PM  Result Value Ref Range   Color, UA yellow    Clarity, UA clear    Glucose, UA negative    Bilirubin, UA negative    Ketones, UA negative    Spec Grav, UA 1.025    Blood, UA negative    pH, UA 5.5    Protein, UA trace    Urobilinogen, UA 0.2    Nitrite, UA negative    Leukocytes, UA small (1+) (A) Negative  POCT glycosylated hemoglobin (Hb A1C)     Status: None   Collection Time: 08/11/14  3:30 PM  Result Value Ref Range   Hemoglobin A1C 5.9   POCT CBC     Status: Abnormal   Collection Time: 08/11/14  3:30 PM  Result Value Ref Range   WBC 4.4 (A) 4.6 - 10.2 K/uL   Lymph, poc 1.4 0.6 - 3.4   POC LYMPH PERCENT 32.1 10 - 50 %L   MID (cbc) 0.3 0 - 0.9   POC MID % 6.8 0 - 12 %M   POC Granulocyte 2.7 2 - 6.9   Granulocyte percent 61.1 37 - 80 %G   RBC 5.09 4.04 - 5.48 M/uL   Hemoglobin 13.6 12.2 - 16.2 g/dL   HCT, POC 42.6 37.7 - 47.9 %   MCV 83.6 80 - 97 fL   MCH, POC 26.8 (A) 27 - 31.2 pg   MCHC 32.0 31.8 - 35.4 g/dL   RDW, POC 13.9 %   Platelet Count, POC 246 142 - 424 K/uL   MPV 8.6 0 - 99.8 fL  POCT urine pregnancy     Status: None   Collection Time: 08/11/14  3:30 PM  Result Value Ref Range   Preg Test, Ur Negative Negative  HIV antibody (with reflex)     Status: None   Collection Time: 08/11/14  3:39 PM  Result Value Ref Range   HIV 1&2 Ab, 4th Generation NONREACTIVE NONREACTIVE    Comment:   HIV-1 antigen and HIV-1/HIV-2 antibodies were not detected.  There is no laboratory evidence of HIV infection.   HIV-1/2 Antibody Diff        Not indicated. HIV-1 RNA, Qual TMA          Not indicated.     PLEASE NOTE: This information has been disclosed to you from records whose confidentiality may be protected by state law. If your state requires such protection, then the state law prohibits you from making any further disclosure of the information without the specific written consent of the person to whom it pertains, or  as otherwise permitted by law. A general authorization for the release of medical or other information is NOT sufficient for this purpose.   The performance of this assay has not been clinically validated in patients less than 80 years old.   For additional information please refer to http://education.questdiagnostics.com/faq/FAQ106.  (This link is being provided for informational/educational purposes only.)     RPR     Status: None   Collection Time: 08/11/14  3:39 PM  Result Value Ref Range   RPR Ser Ql NON  REAC NON REAC    Comment:   Footnotes:  (1) ** Please note change in unit of measure and reference range(s). **     COMPLETE METABOLIC PANEL WITH GFR     Status: None   Collection Time: 08/11/14  3:39 PM  Result Value Ref Range   Sodium 139 135 - 146 mmol/L   Potassium 4.0 3.5 - 5.3 mmol/L   Chloride 105 98 - 110 mmol/L   CO2 25 20 - 31 mmol/L   Glucose, Bld 88 65 - 99 mg/dL   BUN 10 7 - 25 mg/dL   Creat 0.76 0.50 - 1.10 mg/dL   Total Bilirubin 0.5 0.2 - 1.2 mg/dL   Alkaline Phosphatase 51 33 - 115 U/L   AST 22 10 - 30 U/L   ALT 16 6 - 29 U/L   Total Protein 6.8 6.1 - 8.1 g/dL   Albumin 4.2 3.6 - 5.1 g/dL   Calcium 9.2 8.6 - 10.2 mg/dL   GFR, Est African American >89 >=60 mL/min   GFR, Est Non African American >89 >=60 mL/min    Comment:   The estimated GFR is a calculation valid for adults (>=20 years old) that uses the CKD-EPI algorithm to adjust for age and sex. It is   not to be used for children, pregnant women, hospitalized patients,    patients on dialysis, or with rapidly changing kidney function. According to the NKDEP, eGFR >89 is normal, 60-89 shows mild impairment, 30-59 shows moderate impairment, 15-29 shows severe impairment and <15 is ESRD.     Sedimentation Rate     Status: None   Collection Time: 08/11/14  3:39 PM  Result Value Ref Range   Sed Rate 1 0 - 20 mm/hr     No results found for this or any previous visit (from the past 24  hour(s)).  ASSESSMENT & PLAN  Kathryn Roberson was seen today for generalized body aches.  Diagnoses and all orders for this visit:  Other fatigue: Patient with reassuring work up and stable vitals.  She does not complain of rash and/or tic bites.  She appears to dehydrated, and does not complain of any urinary symptoms.  Advised that she vigorously hydrate and to RTC if her symptoms do not improve.   Orders: -     POCT UA - Microscopic Only -     POCT urinalysis dipstick -     POCT glycosylated hemoglobin (Hb A1C) -     POCT CBC -     POCT urine pregnancy -     COMPLETE METABOLIC PANEL WITH GFR -     Sedimentation Rate -     Urine culture  Screening Orders: -     HIV antibody (with reflex) -     RPR    The patient was advised to call or come back to clinic if she does not see an improvement in symptoms, or worsens with the above plan.   Philis Fendt, MHS, PA-C [facility] 08/13/2014 7:19 PM

## 2014-08-12 ENCOUNTER — Telehealth: Payer: Self-pay

## 2014-08-12 LAB — COMPLETE METABOLIC PANEL WITH GFR
ALT: 16 U/L (ref 6–29)
AST: 22 U/L (ref 10–30)
Albumin: 4.2 g/dL (ref 3.6–5.1)
Alkaline Phosphatase: 51 U/L (ref 33–115)
BUN: 10 mg/dL (ref 7–25)
CO2: 25 mmol/L (ref 20–31)
Calcium: 9.2 mg/dL (ref 8.6–10.2)
Chloride: 105 mmol/L (ref 98–110)
Creat: 0.76 mg/dL (ref 0.50–1.10)
GFR, Est African American: >89 mL/min (ref >=60)
GFR, Est Non African American: >89 mL/min (ref >=60)
Glucose, Bld: 88 mg/dL (ref 65–99)
Potassium: 4 mmol/L (ref 3.5–5.3)
Sodium: 139 mmol/L (ref 135–146)
Total Bilirubin: 0.5 mg/dL (ref 0.2–1.2)
Total Protein: 6.8 g/dL (ref 6.1–8.1)

## 2014-08-12 LAB — RPR

## 2014-08-12 LAB — SEDIMENTATION RATE: Sed Rate: 1 mm/hr (ref 0–20)

## 2014-08-12 LAB — HIV ANTIBODY (ROUTINE TESTING W REFLEX): HIV 1&2 Ab, 4th Generation: NONREACTIVE

## 2014-08-12 NOTE — Telephone Encounter (Signed)
Patient was anticipating a call back from Kathryn Roberson regarding additional testing.   She was seen yesterday.   [phone] (H)

## 2014-08-13 NOTE — Telephone Encounter (Signed)
I have tried to call patient back several times now.  Her labs have been reviewed and are all normal.  Please advise that she RTC if she remains symptomatic. Philis Fendt, MS, PA-C   3:43 PM, 08/13/2014

## 2014-08-14 LAB — URINE CULTURE
Colony Count: NO GROWTH
Organism ID, Bacteria: NO GROWTH

## 2014-08-18 NOTE — Progress Notes (Signed)
  Medical screening examination/treatment/procedure(s) were performed by non-physician practitioner and as supervising physician I was immediately available for consultation/collaboration.     

## 2016-03-31 ENCOUNTER — Encounter: Payer: Self-pay | Admitting: Obstetrics & Gynecology

## 2016-05-12 ENCOUNTER — Telehealth: Payer: Self-pay | Admitting: *Deleted

## 2016-05-12 NOTE — Telephone Encounter (Signed)
Pt had annual exam on 03/17/16 had diag mammogram and left breast ultrasound on 03/31/16 as [facility], told to come back for ultrasound guided biopsy, pt scheduled on 05/20/16. Pt asked if you would be willing to prescribe Xanax or Ativan to help keep her calm? Please advise

## 2016-05-14 MED ORDER — ALPRAZOLAM 0.25 MG PO TABS: ORAL_TABLET | ORAL | 1 refills | Status: DC

## 2016-05-14 NOTE — Telephone Encounter (Signed)
Rx called in 

## 2016-05-14 NOTE — Telephone Encounter (Signed)
Agree with Xanax 0.25 mg 1-2 tab PO prior to procedure.  #2, no refill.

## 2016-05-14 NOTE — Telephone Encounter (Signed)
Left message for pt to call to find out which pharmacy she would like Rx called into.

## 2016-05-18 MED ORDER — ALPRAZOLAM 0.25 MG PO TABS: ORAL_TABLET | ORAL | 0 refills | Status: DC

## 2016-05-18 NOTE — Addendum Note (Signed)
Addended by: Thamas Jaegers on: 05/18/2016 11:50 AM   Modules accepted: Orders

## 2016-05-18 NOTE — Telephone Encounter (Signed)
Pt called back stating pharmacy never had Rx, I  Called and spoke with pharmacist and they did not have Rx even though it had been called in on 05/14/16. Rx called in today. Pt aware.

## 2016-05-20 ENCOUNTER — Encounter: Payer: Self-pay | Admitting: Obstetrics & Gynecology

## 2016-05-20 ENCOUNTER — Other Ambulatory Visit: Payer: Self-pay | Admitting: Radiology

## 2016-10-11 ENCOUNTER — Emergency Department (HOSPITAL_COMMUNITY): Payer: No Typology Code available for payment source

## 2016-10-11 ENCOUNTER — Encounter (HOSPITAL_COMMUNITY): Payer: Self-pay | Admitting: Emergency Medicine

## 2016-10-11 ENCOUNTER — Emergency Department (HOSPITAL_COMMUNITY)
Admission: EM | Admit: 2016-10-11 | Discharge: 2016-10-11 | Disposition: A | Discharge status: Home / Self Care | Payer: No Typology Code available for payment source | Attending: Emergency Medicine | Admitting: Emergency Medicine

## 2016-10-11 DIAGNOSIS — Z79899 Other long term (current) drug therapy: Secondary | ICD-10-CM | POA: Diagnosis not present

## 2016-10-11 DIAGNOSIS — M546 Pain in thoracic spine: Secondary | ICD-10-CM | POA: Diagnosis not present

## 2016-10-11 DIAGNOSIS — M545 Low back pain, unspecified: Secondary | ICD-10-CM

## 2016-10-11 DIAGNOSIS — Y92410 Unspecified street and highway as the place of occurrence of the external cause: Secondary | ICD-10-CM | POA: Insufficient documentation

## 2016-10-11 DIAGNOSIS — Y999 Unspecified external cause status: Secondary | ICD-10-CM | POA: Insufficient documentation

## 2016-10-11 DIAGNOSIS — Y9389 Activity, other specified: Secondary | ICD-10-CM | POA: Insufficient documentation

## 2016-10-11 LAB — POC URINE PREG, ED: Preg Test, Ur: NEGATIVE

## 2016-10-11 MED ORDER — LIDOCAINE 5 % EX PTCH: 1.0000 | MEDICATED_PATCH | CUTANEOUS | 0 refills | Status: DC

## 2016-10-11 MED ORDER — METHOCARBAMOL 500 MG PO TABS: 500.0000 mg | ORAL_TABLET | Freq: Two times a day (BID) | ORAL | 0 refills | Status: DC

## 2016-10-11 MED ORDER — IBUPROFEN 600 MG PO TABS: 600.0000 mg | ORAL_TABLET | Freq: Four times a day (QID) | ORAL | 0 refills | Status: DC | PRN
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
## 2016-10-11 NOTE — Discharge Instructions (Signed)
There were no acute abnormalities on the x-rays. Expect your soreness to increase over the next 2-3 days. Take it easy, but do not lay around too much as this may make any stiffness worse.  Antiinflammatory medications: Take 600 mg of ibuprofen every 6 hours or 440 mg (over the counter dose) to 500 mg (prescription dose) of naproxen every 12 hours for the next 3 days. After this time, these medications may be used as needed for pain. Take these medications with food to avoid upset stomach. Choose only one of these medications, do not take them together.  Tylenol: Should you continue to have additional pain while taking the ibuprofen or naproxen, you may add in tylenol as needed. Your daily total maximum amount of tylenol from all sources should be limited to 4000mg /day for persons without liver problems, or 2000mg /day for those with liver problems. Muscle relaxer: Robaxin is a muscle relaxer and may help loosen stiff muscles. Do not take the Robaxin while driving or performing other dangerous activities.  Lidocaine patches: These are available via either prescription or over-the-counter. The over-the-counter option may be more economical one and are likely just as effective. There are multiple over-the-counter brands, such as Salonpas. Exercises: Be sure to perform the attached exercises starting with three times a week and working up to performing them daily. This is an essential part of preventing long term problems.   Follow up with a primary care provider for any future management of these complaints.
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
## 2016-11-17 DIAGNOSIS — N6322 Unspecified lump in the left breast, upper inner quadrant: Secondary | ICD-10-CM | POA: Diagnosis not present

## 2016-11-17 DIAGNOSIS — N6324 Unspecified lump in the left breast, lower inner quadrant: Secondary | ICD-10-CM | POA: Diagnosis not present

## 2017-04-14 ENCOUNTER — Encounter: Payer: Self-pay | Admitting: Women's Health

## 2017-04-14 ENCOUNTER — Ambulatory Visit: Payer: BLUE CROSS/BLUE SHIELD | Admitting: Women's Health

## 2017-04-14 VITALS — BP 138/88 | Ht 71.0 in | Wt 175.6 lb

## 2017-04-14 DIAGNOSIS — B9689 Other specified bacterial agents as the cause of diseases classified elsewhere: Secondary | ICD-10-CM | POA: Diagnosis not present

## 2017-04-14 DIAGNOSIS — Z113 Encounter for screening for infections with a predominantly sexual mode of transmission: Secondary | ICD-10-CM

## 2017-04-14 DIAGNOSIS — N898 Other specified noninflammatory disorders of vagina: Secondary | ICD-10-CM | POA: Diagnosis not present

## 2017-04-14 DIAGNOSIS — N760 Acute vaginitis: Secondary | ICD-10-CM | POA: Diagnosis not present

## 2017-04-14 LAB — WET PREP FOR TRICH, YEAST, CLUE

## 2017-04-14 MED ORDER — METRONIDAZOLE 500 MG PO TABS: 500.0000 mg | ORAL_TABLET | Freq: Two times a day (BID) | ORAL | 0 refills | Status: DC

## 2017-04-14 NOTE — Progress Notes (Signed)
32 year old MBF G1P1 presents with complaint of right labial swelling/irritation with tenderness and vaginal irritation for the past 3 days.  Denies vaginal itching, odor, abdominal pain or fever.  Same partner greater than 5 years, denies need for STD screen.  Denies herpes history.  Monthly cycle on NuvaRing without complaint.  Daughter 4- doing well.  Exam: Appears well.  No CVAT.  External genitalia on left labia small less than 1 cm ulcerated area HSV culture taken.  Speculum exam moderate amount of adherent white discharge noted, minimal erythema no odor noted, white prep positive for clues, TNTC bacteria.  Bimanual no CMT or adnexal tenderness.  Bacterial vaginosis Questionable HSV  Plan: Flagyl 500 twice daily for 7 days, alcohol precautions reviewed.  Instructed to call if no relief of symptoms.  HSV culture pending.

## 2017-04-14 NOTE — Patient Instructions (Signed)

## 2017-04-16 LAB — SURESWAB HSV, TYPE 1/2 DNA, PCR
HSV 1 DNA: NOT DETECTED
HSV 2 DNA: NOT DETECTED

## 2017-05-13 ENCOUNTER — Ambulatory Visit (INDEPENDENT_AMBULATORY_CARE_PROVIDER_SITE_OTHER): Payer: BLUE CROSS/BLUE SHIELD | Admitting: Obstetrics & Gynecology

## 2017-05-13 ENCOUNTER — Encounter: Payer: Self-pay | Admitting: Obstetrics & Gynecology

## 2017-05-13 VITALS — BP 140/90 | Ht 70.5 in | Wt 175.6 lb

## 2017-05-13 DIAGNOSIS — Z3044 Encounter for surveillance of vaginal ring hormonal contraceptive device: Secondary | ICD-10-CM | POA: Diagnosis not present

## 2017-05-13 DIAGNOSIS — Z01419 Encounter for gynecological examination (general) (routine) without abnormal findings: Secondary | ICD-10-CM | POA: Diagnosis not present

## 2017-05-13 DIAGNOSIS — Z803 Family history of malignant neoplasm of breast: Secondary | ICD-10-CM | POA: Diagnosis not present

## 2017-05-13 DIAGNOSIS — Z1151 Encounter for screening for human papillomavirus (HPV): Secondary | ICD-10-CM | POA: Diagnosis not present

## 2017-05-13 DIAGNOSIS — Z113 Encounter for screening for infections with a predominantly sexual mode of transmission: Secondary | ICD-10-CM

## 2017-05-13 DIAGNOSIS — D242 Benign neoplasm of left breast: Secondary | ICD-10-CM

## 2017-05-13 MED ORDER — NUVARING 0.12-0.015 MG/24HR VA RING: VAGINAL_RING | VAGINAL | 4 refills | Status: DC

## 2017-05-13 NOTE — Progress Notes (Signed)
Kathryn Roberson [birth year] [id number]   History:    32 y.o. G1P1L1 Longterm relationship/Father of daughter.  Daughter is 66 yo, doing very well.  RP:  Established patient presenting for annual gyn exam   HPI: Well on Nuvaring.  Treated with Flagyl for BV 04/2017.  Normal vaginal secretions post Rx.  No pelvic pain.  No pain with IC.  Urine/BMs wnl.  Breasts Rt normal, Lt stable lump Bxed 05/2016 Fibroadenoma.  Mother with Breast Ca at age 16. BMI 24.84.  Fit/good nutrition.    Past medical history,surgical history, family history and social history were all reviewed and documented in the EPIC chart.  Gynecologic History Patient's last menstrual period was 04/26/2017. Contraception: NuvaRing vaginal inserts Last Pap: 03/2016. Results were: Negative.  Full STI screen 03/2016 negative Last mammogram: 03/2016. Results were: Rt breast negative.  Lt breast Nodule.  Bx left breast 05/2016: Fibroadenoma Bone Density: Never Colonoscopy: Never  Obstetric History OB History  Gravida Para Term Preterm AB Living  1 1       1   SAB TAB Ectopic Multiple Live Births               # Outcome Date GA Lbr Len/2nd Weight Sex Delivery Anes PTL Lv  1 Para              ROS: A ROS was performed and pertinent positives and negatives are included in the history.  GENERAL: No fevers or chills. HEENT: No change in vision, no earache, sore throat or sinus congestion. NECK: No pain or stiffness. CARDIOVASCULAR: No chest pain or pressure. No palpitations. PULMONARY: No shortness of breath, cough or wheeze. GASTROINTESTINAL: No abdominal pain, nausea, vomiting or diarrhea, melena or bright red blood per rectum. GENITOURINARY: No urinary frequency, urgency, hesitancy or dysuria. MUSCULOSKELETAL: No joint or muscle pain, no back pain, no recent trauma. DERMATOLOGIC: No rash, no itching, no lesions. ENDOCRINE: No polyuria, polydipsia, no heat or cold intolerance. No recent change in weight. HEMATOLOGICAL: No anemia or  easy bruising or bleeding. NEUROLOGIC: No headache, seizures, numbness, tingling or weakness. PSYCHIATRIC: No depression, no loss of interest in normal activity or change in sleep pattern.     Exam:   BP 140/90   Ht 5' 10.5" (1.791 m)   Wt 175 lb 9.6 oz (79.7 kg)   LMP 04/26/2017 Comment: nuvaring  BMI 24.84 kg/m   Body mass index is 24.84 kg/m.  General appearance : Well developed well nourished female. No acute distress HEENT: Eyes: no retinal hemorrhage or exudates,  Neck supple, trachea midline, no carotid bruits, no thyroidmegaly Lungs: Clear to auscultation, no rhonchi or wheezes, or rib retractions  Heart: Regular rate and rhythm, no murmurs or gallops Breast:Examined in sitting and supine position were symmetrical in appearance, no palpable masses or tenderness,  no skin retraction, no nipple inversion, no nipple discharge, no skin discoloration, no axillary or supraclavicular lymphadenopathy Abdomen: no palpable masses or tenderness, no rebound or guarding Extremities: no edema or skin discoloration or tenderness  Pelvic: Vulva: Normal             Vagina: No gross lesions or discharge  Cervix: No gross lesions or discharge.  Pap/HR HPV/Gono-Chlam done  Uterus  AV, normal size, shape and consistency, non-tender and mobile  Adnexa  Without masses or tenderness  Anus: Normal   Assessment/Plan:  32 y.o. female for annual exam   1. Encounter for routine gynecological examination with Papanicolaou smear of cervix Normal gynecologic exam.  Pap with high-risk HPV done today.  Breast exam stable with a small nodule on the left breast at 2:00 corresponding to the site of the fibroadenoma.  Follow-up left diagnostic mammogram and ultrasound scheduled this month.  Continue with fitness and good nutrition.  2. Encounter for surveillance of vaginal ring hormonal contraceptive device Well on NuvaRing.  No contraindication.  NuvaRing represcribed.  3. Screen for STD (sexually  transmitted disease) - Gono-Chlam on pap - HIV antibody (with reflex) - RPR - Hepatitis C Antibody - Hepatitis B Surface AntiGEN  4. Breast fibroadenoma in female, left Left breast biopsy May 2018 showed a fibroadenoma.  Follow-up left diagnostic mammogram and ultrasound this month.  5. Family history of breast cancer in mother Mother Dxed with Breast Ca at 28.  Other orders - NUVARING 0.12-0.015 MG/24HR vaginal ring; Insert vaginal ring vaginally x 3 or 4 weeks.  Princess Bruins MD, 2:15 PM 05/13/2017

## 2017-05-13 NOTE — Patient Instructions (Signed)
1. Encounter for routine gynecological examination with Papanicolaou smear of cervix Normal gynecologic exam.  Pap with high-risk HPV done today.  Breast exam stable with a small nodule on the left breast at 2:00 corresponding to the site of the fibroadenoma.  Follow-up left diagnostic mammogram and ultrasound scheduled this month.  Continue with fitness and good nutrition.  2. Encounter for surveillance of vaginal ring hormonal contraceptive device Well on NuvaRing.  No contraindication.  NuvaRing represcribed.  3. Screen for STD (sexually transmitted disease) - Gono-Chlam on pap - HIV antibody (with reflex) - RPR - Hepatitis C Antibody - Hepatitis B Surface AntiGEN  4. Breast fibroadenoma in female, left Left breast biopsy May 2018 showed a fibroadenoma.  Follow-up left diagnostic mammogram and ultrasound this month.  5. Family history of breast cancer in mother Mother Dxed with Breast Ca at 59.  Other orders - NUVARING 0.12-0.015 MG/24HR vaginal ring; Insert vaginal ring vaginally x 3 or 4 weeks.  Cimone, it was a pleasure seeing you today!  I will inform you of your results as soon as they are available.

## 2017-05-13 NOTE — Addendum Note (Signed)
Addended by: Thurnell Garbe A on: 05/13/2017 04:38 PM   Modules accepted: Orders

## 2017-05-14 LAB — RPR: RPR Ser Ql: NONREACTIVE

## 2017-05-14 LAB — HEPATITIS B SURFACE ANTIGEN: Hepatitis B Surface Ag: NONREACTIVE

## 2017-05-14 LAB — HEPATITIS C ANTIBODY
Hepatitis C Ab: NONREACTIVE
SIGNAL TO CUT-OFF: 0.01 (ref <1.00)

## 2017-05-14 LAB — HIV ANTIBODY (ROUTINE TESTING W REFLEX): HIV 1&2 Ab, 4th Generation: NONREACTIVE

## 2017-05-17 LAB — PAP IG, CT-NG NAA, HPV HIGH-RISK
C. trachomatis RNA, TMA: NOT DETECTED
HPV DNA High Risk: DETECTED — AB
N. gonorrhoeae RNA, TMA: NOT DETECTED

## 2017-05-25 DIAGNOSIS — N6322 Unspecified lump in the left breast, upper inner quadrant: Secondary | ICD-10-CM | POA: Diagnosis not present

## 2017-05-25 DIAGNOSIS — N6324 Unspecified lump in the left breast, lower inner quadrant: Secondary | ICD-10-CM | POA: Diagnosis not present

## 2017-05-25 DIAGNOSIS — N6323 Unspecified lump in the left breast, lower outer quadrant: Secondary | ICD-10-CM | POA: Diagnosis not present

## 2017-06-22 ENCOUNTER — Ambulatory Visit: Payer: BLUE CROSS/BLUE SHIELD | Admitting: Obstetrics & Gynecology

## 2017-06-22 DIAGNOSIS — Z0289 Encounter for other administrative examinations: Secondary | ICD-10-CM

## 2017-11-25 ENCOUNTER — Encounter: Payer: Self-pay | Admitting: Obstetrics & Gynecology

## 2017-11-25 DIAGNOSIS — N6321 Unspecified lump in the left breast, upper outer quadrant: Secondary | ICD-10-CM | POA: Diagnosis not present

## 2017-11-25 DIAGNOSIS — N6324 Unspecified lump in the left breast, lower inner quadrant: Secondary | ICD-10-CM | POA: Diagnosis not present

## 2017-11-25 DIAGNOSIS — N6322 Unspecified lump in the left breast, upper inner quadrant: Secondary | ICD-10-CM | POA: Diagnosis not present

## 2018-04-04 DIAGNOSIS — I10 Essential (primary) hypertension: Secondary | ICD-10-CM | POA: Diagnosis not present

## 2018-04-07 ENCOUNTER — Encounter: Payer: Self-pay | Admitting: *Deleted

## 2018-04-07 NOTE — Progress Notes (Signed)
Harriet from Dr Darien Ramus office called for records for coordination of care. Mammos/paps/notes from 2018 and 2019 sent to them by fax [phone] [facility] CMA

## 2018-04-25 DIAGNOSIS — I10 Essential (primary) hypertension: Secondary | ICD-10-CM | POA: Diagnosis not present

## 2018-05-13 ENCOUNTER — Other Ambulatory Visit: Payer: Self-pay

## 2018-05-16 ENCOUNTER — Other Ambulatory Visit: Payer: Self-pay

## 2018-05-16 ENCOUNTER — Encounter: Payer: Self-pay | Admitting: Obstetrics & Gynecology

## 2018-05-16 ENCOUNTER — Ambulatory Visit: Payer: BLUE CROSS/BLUE SHIELD | Admitting: Obstetrics & Gynecology

## 2018-05-16 VITALS — BP 148/92 | Ht 70.5 in | Wt 180.0 lb

## 2018-05-16 DIAGNOSIS — Z01419 Encounter for gynecological examination (general) (routine) without abnormal findings: Secondary | ICD-10-CM

## 2018-05-16 DIAGNOSIS — Z113 Encounter for screening for infections with a predominantly sexual mode of transmission: Secondary | ICD-10-CM

## 2018-05-16 DIAGNOSIS — R87810 Cervical high risk human papillomavirus (HPV) DNA test positive: Secondary | ICD-10-CM | POA: Diagnosis not present

## 2018-05-16 DIAGNOSIS — Z3044 Encounter for surveillance of vaginal ring hormonal contraceptive device: Secondary | ICD-10-CM

## 2018-05-16 DIAGNOSIS — Z1151 Encounter for screening for human papillomavirus (HPV): Secondary | ICD-10-CM

## 2018-05-16 MED ORDER — NUVARING 0.12-0.015 MG/24HR VA RING: VAGINAL_RING | VAGINAL | 4 refills | Status: DC

## 2018-05-16 NOTE — Progress Notes (Signed)
Kathryn Roberson 29-May-1985 [id number]   History:    33 y.o. G1P1L1 long-term relationship/father of daughter.  Daughter is 40 years old  RP:  Established patient presenting for annual gyn exam   HPI: Well on Nuvaring.  No BTB.  No pelvic pain.  No pain with IC.  Urine/BMs normal.  Breasts wnl, left Fibroadenoma, mild growth on Breast ultrasound November 2019.  A repeat ultrasound is planned this June.  Body mass index 25.46.  Physically active and healthy nutrition.  Past medical history,surgical history, family history and social history were all reviewed and documented in the EPIC chart.  Gynecologic History Patient's last menstrual period was 04/30/2018. Contraception: NuvaRing vaginal inserts Last Pap: 05/2017. Results were: Negative/HPV HR positive Last mammogram: 05/2017. Results were: Probably benign. Left Fibroadenoma.   Bone Density: Never Colonoscopy: Never  Obstetric History OB History  Gravida Para Term Preterm AB Living  1 1       1   SAB TAB Ectopic Multiple Live Births               # Outcome Date GA Lbr Len/2nd Weight Sex Delivery Anes PTL Lv  1 Para              ROS: A ROS was performed and pertinent positives and negatives are included in the history.  GENERAL: No fevers or chills. HEENT: No change in vision, no earache, sore throat or sinus congestion. NECK: No pain or stiffness. CARDIOVASCULAR: No chest pain or pressure. No palpitations. PULMONARY: No shortness of breath, cough or wheeze. GASTROINTESTINAL: No abdominal pain, nausea, vomiting or diarrhea, melena or bright red blood per rectum. GENITOURINARY: No urinary frequency, urgency, hesitancy or dysuria. MUSCULOSKELETAL: No joint or muscle pain, no back pain, no recent trauma. DERMATOLOGIC: No rash, no itching, no lesions. ENDOCRINE: No polyuria, polydipsia, no heat or cold intolerance. No recent change in weight. HEMATOLOGICAL: No anemia or easy bruising or bleeding. NEUROLOGIC: No headache, seizures,  numbness, tingling or weakness. PSYCHIATRIC: No depression, no loss of interest in normal activity or change in sleep pattern.     Exam:   BP (!) 148/92   Ht 5' 10.5" (1.791 m)   Wt 180 lb (81.6 kg)   LMP 04/30/2018 Comment: nuvaring  BMI 25.46 kg/m   Body mass index is 25.46 kg/m.  General appearance : Well developed well nourished female. No acute distress HEENT: Eyes: no retinal hemorrhage or exudates,  Neck supple, trachea midline, no carotid bruits, no thyroidmegaly Lungs: Clear to auscultation, no rhonchi or wheezes, or rib retractions  Heart: Regular rate and rhythm, no murmurs or gallops Breast:Examined in sitting and supine position were symmetrical in appearance, no palpable masses or tenderness,  no skin retraction, no nipple inversion, no nipple discharge, no skin discoloration, no axillary or supraclavicular lymphadenopathy Abdomen: no palpable masses or tenderness, no rebound or guarding Extremities: no edema or skin discoloration or tenderness  Pelvic: Vulva: Normal             Vagina: No gross lesions or discharge  Cervix: No gross lesions or discharge.  Pap/HPV HR done.  Gono-Chlam done.  Uterus AV, normal size, shape and consistency, non-tender and mobile  Adnexa  Without masses or tenderness  Anus: Normal   Assessment/Plan:  33 y.o. female for annual exam   1. Encounter for routine gynecological examination with Papanicolaou smear of cervix Normal gynecologic exam.  Pap with high-risk HPV done today.  Breast exam normal, except for left breast fibroadenoma at  3:00.  Breast ultrasound plan June 2020.  Body mass index 25.46.  Continue with fitness and healthy nutrition. - Pap IG, CT/NG NAA, and HPV (high risk)  2. Cervical high risk HPV (human papillomavirus) test positive Pap test with high-risk HPV done today.  3. Encounter for surveillance of vaginal ring hormonal contraceptive device Well on NuvaRing.  No contraindication to continue.  Prescription sent  to pharmacy.  4. Screen for STD (sexually transmitted disease) Strict condom use recommended. - Gono-Chlam on Pap - HIV antibody (with reflex) - RPR - Hepatitis C Antibody - Hepatitis B Surface AntiGEN - Pap IG, CT/NG NAA, and HPV (high risk)  5. Special screening examination for human papillomavirus (HPV) - Pap IG, CT/NG NAA, and HPV (high risk)  Other orders - NUVARING 0.12-0.015 MG/24HR vaginal ring; Insert vaginal ring vaginally x 3 or 4 weeks. - amLODipine (NORVASC) 5 MG tablet; Take 5 mg by mouth daily.  Princess Bruins MD, 2:49 PM 05/16/2018

## 2018-05-17 ENCOUNTER — Encounter: Payer: Self-pay | Admitting: Obstetrics & Gynecology

## 2018-05-17 LAB — RPR: RPR Ser Ql: NONREACTIVE

## 2018-05-17 LAB — HIV ANTIBODY (ROUTINE TESTING W REFLEX): HIV 1&2 Ab, 4th Generation: NONREACTIVE

## 2018-05-17 LAB — HEPATITIS B SURFACE ANTIGEN: Hepatitis B Surface Ag: NONREACTIVE

## 2018-05-17 LAB — HEPATITIS C ANTIBODY
Hepatitis C Ab: NONREACTIVE
SIGNAL TO CUT-OFF: 0.02 (ref <1.00)

## 2018-05-17 NOTE — Patient Instructions (Signed)
1. Encounter for routine gynecological examination with Papanicolaou smear of cervix Normal gynecologic exam.  Pap with high-risk HPV done today.  Breast exam normal, except for left breast fibroadenoma at 3:00.  Breast ultrasound plan June 2020.  Body mass index 25.46.  Continue with fitness and healthy nutrition. - Pap IG, CT/NG NAA, and HPV (high risk)  2. Cervical high risk HPV (human papillomavirus) test positive Pap test with high-risk HPV done today.  3. Encounter for surveillance of vaginal ring hormonal contraceptive device Well on NuvaRing.  No contraindication to continue.  Prescription sent to pharmacy.  4. Screen for STD (sexually transmitted disease) Strict condom use recommended. - Gono-Chlam on Pap - HIV antibody (with reflex) - RPR - Hepatitis C Antibody - Hepatitis B Surface AntiGEN - Pap IG, CT/NG NAA, and HPV (high risk)  5. Special screening examination for human papillomavirus (HPV) - Pap IG, CT/NG NAA, and HPV (high risk)  Other orders - NUVARING 0.12-0.015 MG/24HR vaginal ring; Insert vaginal ring vaginally x 3 or 4 weeks. - amLODipine (NORVASC) 5 MG tablet; Take 5 mg by mouth daily.  Kathryn Roberson, it is a pleasure seeing you today!  I will inform you of your results as soon as they are available.

## 2018-05-18 LAB — PAP IG, CT-NG NAA, HPV HIGH-RISK
C. trachomatis RNA, TMA: NOT DETECTED
HPV DNA High Risk: NOT DETECTED
N. gonorrhoeae RNA, TMA: NOT DETECTED

## 2018-06-02 DIAGNOSIS — N6321 Unspecified lump in the left breast, upper outer quadrant: Secondary | ICD-10-CM | POA: Diagnosis not present

## 2018-12-09 DIAGNOSIS — Z03818 Encounter for observation for suspected exposure to other biological agents ruled out: Secondary | ICD-10-CM | POA: Diagnosis not present
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
## 2019-05-23 ENCOUNTER — Encounter: Payer: BLUE CROSS/BLUE SHIELD | Admitting: Obstetrics & Gynecology

## 2019-07-17 ENCOUNTER — Other Ambulatory Visit: Payer: Self-pay | Admitting: Obstetrics & Gynecology

## 2019-07-17 NOTE — Telephone Encounter (Signed)
Annual exam scheduled on 07/27/19

## 2019-07-27 ENCOUNTER — Encounter: Payer: BC Managed Care – PPO | Admitting: Obstetrics & Gynecology

## 2019-09-13 DIAGNOSIS — Z20828 Contact with and (suspected) exposure to other viral communicable diseases: Secondary | ICD-10-CM | POA: Diagnosis not present

## 2019-09-22 DIAGNOSIS — Z20828 Contact with and (suspected) exposure to other viral communicable diseases: Secondary | ICD-10-CM | POA: Diagnosis not present

## 2019-10-03 ENCOUNTER — Other Ambulatory Visit: Payer: Self-pay | Admitting: Obstetrics & Gynecology

## 2019-10-03 NOTE — Telephone Encounter (Signed)
Overdue for CE. Last one 05/16/19. She cancelled CE on 05/23/19 and 07/27/19. Now is scheduled for 10/13/19.

## 2019-10-12 ENCOUNTER — Encounter: Payer: Self-pay | Admitting: Obstetrics & Gynecology

## 2019-10-12 ENCOUNTER — Ambulatory Visit: Payer: BC Managed Care – PPO | Admitting: Obstetrics & Gynecology

## 2019-10-12 ENCOUNTER — Other Ambulatory Visit: Payer: Self-pay

## 2019-10-12 VITALS — BP 128/80 | Ht 70.0 in | Wt 178.0 lb

## 2019-10-12 DIAGNOSIS — R87810 Cervical high risk human papillomavirus (HPV) DNA test positive: Secondary | ICD-10-CM

## 2019-10-12 DIAGNOSIS — Z01419 Encounter for gynecological examination (general) (routine) without abnormal findings: Secondary | ICD-10-CM | POA: Diagnosis not present

## 2019-10-12 DIAGNOSIS — Z3044 Encounter for surveillance of vaginal ring hormonal contraceptive device: Secondary | ICD-10-CM

## 2019-10-12 MED ORDER — NUVARING 0.12-0.015 MG/24HR VA RING: VAGINAL_RING | VAGINAL | 4 refills | Status: DC

## 2019-10-12 NOTE — Progress Notes (Signed)
Kathryn Roberson 1985/02/25 [id number]   History:    34 y.o. G1P1L1 long-term relationship/father of daughter.  Daughter is 21 years old  RP:  Established patient presenting for annual gyn exam   HPI: Well on Nuvaring.  No BTB.  No pelvic pain.  No pain with IC. Pap 05/2018 Negative/HPV HR Neg.  HPV HR pos in 2019. Urine/BMs normal.  Breasts wnl, left Fibroadenoma, stable on Breast ultrasounds every 6 months.  Body mass index 25.54.  Physically active, going to the gym and healthy nutrition.  Past medical history,surgical history, family history and social history were all reviewed and documented in the EPIC chart.  Gynecologic History Patient's last menstrual period was 10/03/2019.  Obstetric History OB History  Gravida Para Term Preterm AB Living  1 1       1   SAB TAB Ectopic Multiple Live Births               # Outcome Date GA Lbr Len/2nd Weight Sex Delivery Anes PTL Lv  1 Para              ROS: A ROS was performed and pertinent positives and negatives are included in the history.  GENERAL: No fevers or chills. HEENT: No change in vision, no earache, sore throat or sinus congestion. NECK: No pain or stiffness. CARDIOVASCULAR: No chest pain or pressure. No palpitations. PULMONARY: No shortness of breath, cough or wheeze. GASTROINTESTINAL: No abdominal pain, nausea, vomiting or diarrhea, melena or bright red blood per rectum. GENITOURINARY: No urinary frequency, urgency, hesitancy or dysuria. MUSCULOSKELETAL: No joint or muscle pain, no back pain, no recent trauma. DERMATOLOGIC: No rash, no itching, no lesions. ENDOCRINE: No polyuria, polydipsia, no heat or cold intolerance. No recent change in weight. HEMATOLOGICAL: No anemia or easy bruising or bleeding. NEUROLOGIC: No headache, seizures, numbness, tingling or weakness. PSYCHIATRIC: No depression, no loss of interest in normal activity or change in sleep pattern.     Exam:   BP 128/80 (BP Location: Right Arm, Patient  Position: Sitting, Cuff Size: Normal)   Ht 5\' 10"  (1.778 m)   Wt 178 lb (80.7 kg)   LMP 10/03/2019   BMI 25.54 kg/m   Body mass index is 25.54 kg/m.  General appearance : Well developed well nourished female. No acute distress HEENT: Eyes: no retinal hemorrhage or exudates,  Neck supple, trachea midline, no carotid bruits, no thyroidmegaly Lungs: Clear to auscultation, no rhonchi or wheezes, or rib retractions  Heart: Regular rate and rhythm, no murmurs or gallops Breast:Examined in sitting and supine position were symmetrical in appearance, no palpable masses or tenderness,  no skin retraction, no nipple inversion, no nipple discharge, no skin discoloration, no axillary or supraclavicular lymphadenopathy Abdomen: no palpable masses or tenderness, no rebound or guarding Extremities: no edema or skin discoloration or tenderness  Pelvic: Vulva: Normal             Vagina: No gross lesions or discharge  Cervix: No gross lesions or discharge.  Pap reflex done.  Uterus AV, normal size, shape and consistency, non-tender and mobile  Adnexa  Without masses or tenderness  Anus: Normal   Assessment/Plan:  34 y.o. female for annual exam   1. Encounter for routine gynecological examination with Papanicolaou smear of cervix Normal gynecologic exam.  Pap reflex done.  Breast exam normal except for stable left small nodule at 2:00 to 3:00 compatible with a fibroadenoma which is followed by breast ultrasounds.  Good body mass index at  25.54.  0.54.  Continue with fitness at the gym and healthy nutrition.  - Pap IG w/ reflex to HPV when ASC-U  2. Cervical high risk HPV (human papillomavirus) test positive High risk HPV positive in 2019.  Pap test negative and high-risk HPV negative in 2020.  3. Encounter for surveillance of vaginal ring hormonal contraceptive device Well on NuvaRing.  No contraindication to continue.  NuvaRing represcribed.  Other orders - NUVARING 0.12-0.015 MG/24HR vaginal  ring; INSERT 1 RING VAGINALLY FOR 3-4 WEEKS AS DIRECTED  Princess Bruins MD, 11:33 AM 10/12/2019

## 2019-10-16 LAB — PAP IG W/ RFLX HPV ASCU

## 2020-06-24 DIAGNOSIS — Z20822 Contact with and (suspected) exposure to covid-19: Secondary | ICD-10-CM | POA: Diagnosis not present

## 2020-10-14 ENCOUNTER — Ambulatory Visit: Payer: Self-pay | Admitting: Obstetrics & Gynecology

## 2020-10-28 ENCOUNTER — Other Ambulatory Visit: Payer: Self-pay | Admitting: Obstetrics & Gynecology

## 2020-11-21 ENCOUNTER — Ambulatory Visit: Payer: BC Managed Care – PPO | Admitting: Obstetrics & Gynecology

## 2020-11-21 DIAGNOSIS — Z0289 Encounter for other administrative examinations: Secondary | ICD-10-CM

## 2021-01-18 ENCOUNTER — Other Ambulatory Visit: Payer: Self-pay | Admitting: Obstetrics & Gynecology

## 2022-07-01 ENCOUNTER — Other Ambulatory Visit (HOSPITAL_BASED_OUTPATIENT_CLINIC_OR_DEPARTMENT_OTHER): Payer: Self-pay

## 2022-07-01 ENCOUNTER — Emergency Department (HOSPITAL_BASED_OUTPATIENT_CLINIC_OR_DEPARTMENT_OTHER): Payer: Self-pay | Admitting: Radiology

## 2022-07-01 ENCOUNTER — Other Ambulatory Visit: Payer: Self-pay

## 2022-07-01 ENCOUNTER — Encounter (HOSPITAL_BASED_OUTPATIENT_CLINIC_OR_DEPARTMENT_OTHER): Payer: Self-pay

## 2022-07-01 ENCOUNTER — Emergency Department (HOSPITAL_BASED_OUTPATIENT_CLINIC_OR_DEPARTMENT_OTHER)
Admission: EM | Admit: 2022-07-01 | Discharge: 2022-07-01 | Disposition: A | Discharge status: Home / Self Care | Payer: Self-pay | Attending: Emergency Medicine | Admitting: Emergency Medicine

## 2022-07-01 DIAGNOSIS — Z79899 Other long term (current) drug therapy: Secondary | ICD-10-CM | POA: Insufficient documentation

## 2022-07-01 DIAGNOSIS — M545 Low back pain, unspecified: Secondary | ICD-10-CM | POA: Insufficient documentation

## 2022-07-01 DIAGNOSIS — U071 COVID-19: Secondary | ICD-10-CM | POA: Insufficient documentation

## 2022-07-01 LAB — URINALYSIS, ROUTINE W REFLEX MICROSCOPIC
Bilirubin Urine: NEGATIVE
Glucose, UA: NEGATIVE mg/dL
Hgb urine dipstick: NEGATIVE
Ketones, ur: 15 mg/dL — AB
Leukocytes,Ua: NEGATIVE
Nitrite: NEGATIVE
Specific Gravity, Urine: 1.016 (ref 1.005–1.030)
pH: 8.5 — ABNORMAL HIGH (ref 5.0–8.0)

## 2022-07-01 LAB — COMPREHENSIVE METABOLIC PANEL
ALT: 14 U/L (ref 0–44)
AST: 18 U/L (ref 15–41)
Albumin: 4.6 g/dL (ref 3.5–5.0)
Alkaline Phosphatase: 62 U/L (ref 38–126)
Anion gap: 12 (ref 5–15)
BUN: 10 mg/dL (ref 6–20)
CO2: 22 mmol/L (ref 22–32)
Calcium: 10 mg/dL (ref 8.9–10.3)
Chloride: 102 mmol/L (ref 98–111)
Creatinine, Ser: 1.06 mg/dL — ABNORMAL HIGH (ref 0.44–1.00)
GFR, Estimated: >60 mL/min (ref >60)
Glucose, Bld: 93 mg/dL (ref 70–99)
Potassium: 3.7 mmol/L (ref 3.5–5.1)
Sodium: 136 mmol/L (ref 135–145)
Total Bilirubin: 0.6 mg/dL (ref 0.3–1.2)
Total Protein: 7.8 g/dL (ref 6.5–8.1)

## 2022-07-01 LAB — PREGNANCY, URINE: Preg Test, Ur: NEGATIVE

## 2022-07-01 LAB — CBC
HCT: 41.8 % (ref 36.0–46.0)
Hemoglobin: 14.1 g/dL (ref 12.0–15.0)
MCH: 29 pg (ref 26.0–34.0)
MCHC: 33.7 g/dL (ref 30.0–36.0)
MCV: 85.8 fL (ref 80.0–100.0)
Platelets: 292 10*3/uL (ref 150–400)
RBC: 4.87 MIL/uL (ref 3.87–5.11)
RDW: 13.4 % (ref 11.5–15.5)
WBC: 6.4 10*3/uL (ref 4.0–10.5)
nRBC: 0 % (ref 0.0–0.2)

## 2022-07-01 LAB — RESP PANEL BY RT-PCR (RSV, FLU A&B, COVID)  RVPGX2
Influenza A by PCR: NEGATIVE
Influenza B by PCR: NEGATIVE
Resp Syncytial Virus by PCR: NEGATIVE
SARS Coronavirus 2 by RT PCR: POSITIVE — AB

## 2022-07-01 LAB — TSH: TSH: 0.416 u[IU]/mL (ref 0.350–4.500)

## 2022-07-01 LAB — D-DIMER, QUANTITATIVE: D-Dimer, Quant: 0.27 ug/mL-FEU (ref 0.00–0.50)

## 2022-07-01 LAB — TROPONIN I (HIGH SENSITIVITY): Troponin I (High Sensitivity): <2 ng/L (ref <18)

## 2022-07-01 MED ORDER — SODIUM CHLORIDE 0.9 % IV BOLUS
1000.0000 mL | Freq: Once | INTRAVENOUS | Status: AC
  Administered 2022-07-01: 1000 mL via INTRAVENOUS

## 2022-07-01 MED ORDER — CYCLOBENZAPRINE HCL 5 MG PO TABS
5.0000 mg | ORAL_TABLET | Freq: Once | ORAL | Status: AC
  Administered 2022-07-01: 5 mg via ORAL
  Filled 2022-07-01: qty 1

## 2022-07-01 MED ORDER — KETOROLAC TROMETHAMINE 15 MG/ML IJ SOLN
15.0000 mg | Freq: Once | INTRAMUSCULAR | Status: AC
  Administered 2022-07-01: 15 mg via INTRAVENOUS
  Filled 2022-07-01: qty 1

## 2022-07-01 NOTE — Discharge Instructions (Addendum)
Return to the ED with any new or worsening signs or symptoms Please treat your symptoms conservatively at home.  Please take ibuprofen for body aches and chills.  He may take Tylenol for headaches and fevers.  Please purchase electrolyte supplementation beverages such as Pedialyte.  Please also consume water.  You may take Zicam to help shorten duration of symptoms. Please read attached guides concerning COVID-19 as well as quarantine and isolation Please see the attached work note excusing you from work until 7/2

## 2022-07-01 NOTE — ED Provider Notes (Signed)
[place] EMERGENCY DEPARTMENT AT [facility] Provider Note   CSN: [id number] Arrival date & time: 07/01/22  0847     History  Chief Complaint  Patient presents with   Weakness    Kathryn Roberson is a 37 y.o. female with medical history of heart murmur, headaches, allergies.  The patient presents to the ED for evaluation of weakness.  The patient reports that this morning she woke up feeling very weak.  She goes on to state that she feels very fatigued.  Patient reports that yesterday she had an issue with her allergies, runny nose, and she took an allergy medication which she states did help.  She states that she went to bed in her normal state of health and woke up this morning feeling very weak.  She states that she has a pressure in her chest that she noticed this morning upon waking that is centralized in location and associated with shortness of breath.  She denies any abdominal pain, nausea, vomiting, diarrhea, dysuria, flank pain, vaginal discharge, fevers, sore throat, body aches or chills.  She is endorsing low back pain that is bilateral as well as a headache.  She reports that she recently traveled to [place] 1 week ago for work.  Denies a history of blood clots, denies exogenous hormone use, denies leg swelling, denies mopped assist, denies history of DVT or PE.   Weakness Associated symptoms: chest pain and shortness of breath   Associated symptoms: no cough, no fever and no myalgias        Home Medications Prior to Admission medications   Medication Sig Start Date End Date Taking? Authorizing Provider  amLODipine (NORVASC) 5 MG tablet Take 5 mg by mouth daily.    [provider]  NUVARING 0.12-0.015 MG/24HR vaginal ring INSERT 1 RING VAGINALLY FOR 3 TO 4 WEEKS AS DIRECTED 10/28/20   Genia Del, MD      Allergies    Penicillins    Review of Systems   Review of Systems  Constitutional:  Negative for chills and fever.  HENT:   Negative for sneezing.   Respiratory:  Positive for shortness of breath. Negative for cough.   Cardiovascular:  Positive for chest pain.  Musculoskeletal:  Negative for myalgias.  Neurological:  Positive for weakness.  All other systems reviewed and are negative.   Physical Exam Updated Vital Signs BP (!) 135/91   Pulse 94   Temp 98.6 F (37 C) (Oral)   Resp 17   Wt 80.7 kg   SpO2 97%   BMI 25.54 kg/m  Physical Exam Vitals and nursing note reviewed.  Constitutional:      General: She is not in acute distress.    Appearance: Normal appearance. She is not ill-appearing, toxic-appearing or diaphoretic.  HENT:     Head: Normocephalic and atraumatic.     Nose: Nose normal.     Mouth/Throat:     Mouth: Mucous membranes are moist.     Pharynx: Oropharynx is clear.  Eyes:     Extraocular Movements: Extraocular movements intact.     Conjunctiva/sclera: Conjunctivae normal.     Pupils: Pupils are equal, round, and reactive to light.  Cardiovascular:     Rate and Rhythm: Normal rate and regular rhythm.  Pulmonary:     Effort: Pulmonary effort is normal.     Breath sounds: Normal breath sounds. No wheezing.  Abdominal:     General: Abdomen is flat. Bowel sounds are normal.  Palpations: Abdomen is soft.     Tenderness: There is no abdominal tenderness.  Musculoskeletal:     Cervical back: Normal range of motion and neck supple. No tenderness.     Comments: Bilateral lumbar back TTP, nonfocal, no centralized lumbar spinal tenderness  Skin:    General: Skin is warm and dry.     Capillary Refill: Capillary refill takes less than 2 seconds.  Neurological:     General: No focal deficit present.     Mental Status: She is alert and oriented to person, place, and time.     GCS: GCS eye subscore is 4. GCS verbal subscore is 5. GCS motor subscore is 6.     Cranial Nerves: Cranial nerves 2-12 are intact. No cranial nerve deficit.     Sensory: Sensation is intact. No sensory deficit.      Motor: Motor function is intact. No weakness.     Coordination: Coordination is intact. Heel to [facility] Test normal.     ED Results / Procedures / Treatments   Labs (all labs ordered are listed, but only abnormal results are displayed) Labs Reviewed  RESP PANEL BY RT-PCR (RSV, FLU A&B, COVID)  RVPGX2 - Abnormal; Notable for the following components:      Result Value   SARS Coronavirus 2 by RT PCR POSITIVE (*)    All other components within normal limits  COMPREHENSIVE METABOLIC PANEL - Abnormal; Notable for the following components:   Creatinine, Ser 1.06 (*)    All other components within normal limits  CBC  D-DIMER, QUANTITATIVE  TSH  URINALYSIS, ROUTINE W REFLEX MICROSCOPIC  PREGNANCY, URINE  TROPONIN I (HIGH SENSITIVITY)    EKG EKG Interpretation  Date/Time:  Wednesday July 01 2022 08:52:51 EDT Ventricular Rate:  86 PR Interval:  167 QRS Duration: 77 QT Interval:  344 QTC Calculation: 412 R Axis:   70 Text Interpretation: Sinus rhythm Probable left atrial enlargement Borderline T abnormalities, anterior leads no prior ECG for comparison. No STEMI Confirmed by Theda Belfast ([id number]) on 07/01/2022 11:04:44 AM  Radiology DG Chest 2 View  Result Date: 07/01/2022 CLINICAL DATA:  Shortness of breath, chest pain EXAM: CHEST - 2 VIEW COMPARISON:  None Available. FINDINGS: The heart size and mediastinal contours are within normal limits. Both lungs are clear. The visualized skeletal structures are unremarkable. IMPRESSION: No active cardiopulmonary disease. Electronically Signed   By: Larose Hires D.O.   On: 07/01/2022 10:00    Procedures Procedures   Medications Ordered in ED Medications  ketorolac (TORADOL) 15 MG/ML injection 15 mg (15 mg Intravenous Given 07/01/22 0928)  cyclobenzaprine (FLEXERIL) tablet 5 mg (5 mg Oral Given 07/01/22 0928)  sodium chloride 0.9 % bolus 1,000 mL (1,000 mLs Intravenous New Bag/Given 07/01/22 1025)    ED Course/ Medical Decision Making/  A&P    Medical Decision Making Amount and/or Complexity of Data Reviewed Labs: ordered. Radiology: ordered.  Risk Prescription drug management.   37 year old female presents to ED for evaluation of weakness.  Please see HPI for further details.  On examination the patient is afebrile and nontachycardic.  Lung sounds are clear bilaterally and she is not hypoxic.  Abdomen is soft and compressible throughout.  No CVA tenderness bilaterally.  Neurological examination reassuring with 5 out of 5 strength to lower extremities bilaterally.  Patient CBC without cytosis or anemia.  Metabolic panel without electrolyte derangement, creatinine 1.06, no elevated LFT, no anion gap elevation.  Patient D-dimer not elevated at 0.27.  The patient TSH  is within normal limits at 0.416.  Patient troponin less than 2.  Patient EKG is nonischemic.  Patient chest x-ray shows no consolidations or effusions.  The patient viral panel was positive for coronavirus.  Patient given 1 L fluid.  Patient given 5 mg Flexeril, 15 mg Toradol for back pain.  Patient repeatedly asked for urine sample but she states she is unable to provide one.  She has no white count, she denies dysuria, there is no CVA tenderness bilaterally so low suspicion for UTI at this time.  Most likely patient symptoms secondary to her positive COVID status.  On reassessment patient reports feeling better.  Patient symptoms almost likely secondary to her viral infection.  She was advised to treat symptoms at home conservatively.  She was advised to return to the ED with any new or worsening signs or symptoms.  She had all of her questions answered to her satisfaction.  She is stable to discharge home at this time.  Final Clinical Impression(s) / ED Diagnoses Final diagnoses:  COVID-19    Rx / DC Orders ED Discharge Orders     None         Clent Ridges 07/01/22 1122    Tegeler, Canary Brim, MD 07/01/22 [phone]

## 2022-07-01 NOTE — ED Triage Notes (Signed)
Pt states she woke around 0730 this morning telling her friend she could barely walk and feeling weak. Pt c/o headache and lower back pain.

## 2022-07-01 NOTE — ED Notes (Signed)
ED Provider at bedside.
